# Patient Record
Sex: Female | Born: 1935 | Race: Black or African American | Hispanic: No | Smoking: Former smoker
Health system: Southern US, Community
[De-identification: ages and names within clinical notes are randomized; demographics above are authoritative.]

## PROBLEM LIST (undated history)

## (undated) DIAGNOSIS — I10 Essential (primary) hypertension: Secondary | ICD-10-CM

## (undated) HISTORY — PX: BREAST CYST EXCISION: SHX579

---

## 1997-11-16 ENCOUNTER — Ambulatory Visit (HOSPITAL_COMMUNITY): Admission: RE | Admit: 1997-11-16 | Discharge: 1997-11-16 | Payer: Self-pay | Admitting: Cardiology

## 2000-03-01 ENCOUNTER — Encounter: Payer: Self-pay | Admitting: Cardiology

## 2000-03-01 ENCOUNTER — Encounter: Admission: RE | Admit: 2000-03-01 | Discharge: 2000-03-01 | Payer: Self-pay | Admitting: Cardiology

## 2000-03-07 ENCOUNTER — Encounter: Payer: Self-pay | Admitting: Cardiology

## 2000-03-07 ENCOUNTER — Encounter: Admission: RE | Admit: 2000-03-07 | Discharge: 2000-03-07 | Payer: Self-pay | Admitting: Cardiology

## 2001-03-27 ENCOUNTER — Encounter: Payer: Self-pay | Admitting: Cardiology

## 2001-03-27 ENCOUNTER — Encounter: Admission: RE | Admit: 2001-03-27 | Discharge: 2001-03-27 | Payer: Self-pay | Admitting: Cardiology

## 2002-03-26 ENCOUNTER — Encounter: Admission: RE | Admit: 2002-03-26 | Discharge: 2002-03-26 | Payer: Self-pay | Admitting: Cardiology

## 2002-03-26 ENCOUNTER — Encounter: Payer: Self-pay | Admitting: Cardiology

## 2003-03-11 ENCOUNTER — Encounter: Payer: Self-pay | Admitting: Cardiology

## 2003-03-11 ENCOUNTER — Encounter: Admission: RE | Admit: 2003-03-11 | Discharge: 2003-03-11 | Payer: Self-pay | Admitting: Cardiology

## 2003-03-25 ENCOUNTER — Encounter: Payer: Self-pay | Admitting: Cardiology

## 2003-03-25 ENCOUNTER — Encounter: Admission: RE | Admit: 2003-03-25 | Discharge: 2003-03-25 | Payer: Self-pay | Admitting: Cardiology

## 2003-12-25 ENCOUNTER — Encounter: Admission: RE | Admit: 2003-12-25 | Discharge: 2003-12-25 | Payer: Self-pay | Admitting: Cardiology

## 2004-04-15 ENCOUNTER — Encounter: Admission: RE | Admit: 2004-04-15 | Discharge: 2004-04-15 | Payer: Self-pay | Admitting: Cardiology

## 2005-04-28 ENCOUNTER — Encounter: Admission: RE | Admit: 2005-04-28 | Discharge: 2005-04-28 | Payer: Self-pay | Admitting: Cardiology

## 2005-10-11 ENCOUNTER — Ambulatory Visit (HOSPITAL_COMMUNITY): Admission: RE | Admit: 2005-10-11 | Discharge: 2005-10-11 | Payer: Self-pay | Admitting: Cardiology

## 2006-05-02 ENCOUNTER — Encounter: Admission: RE | Admit: 2006-05-02 | Discharge: 2006-05-02 | Payer: Self-pay | Admitting: Cardiology

## 2007-05-09 ENCOUNTER — Encounter: Admission: RE | Admit: 2007-05-09 | Discharge: 2007-05-09 | Payer: Self-pay | Admitting: Cardiology

## 2008-02-19 ENCOUNTER — Encounter: Admission: RE | Admit: 2008-02-19 | Discharge: 2008-02-19 | Payer: Self-pay | Admitting: Cardiology

## 2008-05-20 ENCOUNTER — Encounter: Admission: RE | Admit: 2008-05-20 | Discharge: 2008-05-20 | Payer: Self-pay | Admitting: Cardiology

## 2008-05-27 ENCOUNTER — Encounter: Admission: RE | Admit: 2008-05-27 | Discharge: 2008-05-27 | Payer: Self-pay | Admitting: Cardiology

## 2009-01-20 ENCOUNTER — Encounter: Admission: RE | Admit: 2009-01-20 | Discharge: 2009-01-20 | Payer: Self-pay | Admitting: Cardiology

## 2009-05-26 ENCOUNTER — Encounter: Admission: RE | Admit: 2009-05-26 | Discharge: 2009-05-26 | Payer: Self-pay | Admitting: Cardiology

## 2009-06-16 ENCOUNTER — Encounter: Admission: RE | Admit: 2009-06-16 | Discharge: 2009-06-16 | Payer: Self-pay | Admitting: Cardiology

## 2009-12-24 ENCOUNTER — Encounter: Admission: RE | Admit: 2009-12-24 | Discharge: 2009-12-24 | Payer: Self-pay | Admitting: Cardiology

## 2010-06-01 ENCOUNTER — Encounter: Admission: RE | Admit: 2010-06-01 | Discharge: 2010-06-01 | Payer: Self-pay | Admitting: Cardiology

## 2011-05-05 ENCOUNTER — Other Ambulatory Visit: Payer: Self-pay | Admitting: Family Medicine

## 2011-05-05 DIAGNOSIS — Z1231 Encounter for screening mammogram for malignant neoplasm of breast: Secondary | ICD-10-CM

## 2011-06-07 ENCOUNTER — Ambulatory Visit
Admission: RE | Admit: 2011-06-07 | Discharge: 2011-06-07 | Disposition: A | Discharge status: Home / Self Care | Payer: Medicare Other | Source: Ambulatory Visit | Attending: Family Medicine | Admitting: Family Medicine

## 2011-06-07 DIAGNOSIS — Z1231 Encounter for screening mammogram for malignant neoplasm of breast: Secondary | ICD-10-CM

## 2012-05-01 ENCOUNTER — Other Ambulatory Visit: Payer: Self-pay | Admitting: Family Medicine

## 2012-05-01 DIAGNOSIS — Z1231 Encounter for screening mammogram for malignant neoplasm of breast: Secondary | ICD-10-CM

## 2012-06-07 ENCOUNTER — Ambulatory Visit
Admission: RE | Admit: 2012-06-07 | Discharge: 2012-06-07 | Disposition: A | Discharge status: Home / Self Care | Payer: Medicare Other | Source: Ambulatory Visit | Attending: Family Medicine | Admitting: Family Medicine

## 2012-06-07 DIAGNOSIS — Z1231 Encounter for screening mammogram for malignant neoplasm of breast: Secondary | ICD-10-CM

## 2013-05-13 ENCOUNTER — Other Ambulatory Visit: Payer: Self-pay

## 2013-05-13 DIAGNOSIS — Z1231 Encounter for screening mammogram for malignant neoplasm of breast: Secondary | ICD-10-CM

## 2013-06-11 ENCOUNTER — Ambulatory Visit
Admission: RE | Admit: 2013-06-11 | Discharge: 2013-06-11 | Disposition: A | Discharge status: Home / Self Care | Payer: Medicare Other | Source: Ambulatory Visit

## 2013-06-11 DIAGNOSIS — Z1231 Encounter for screening mammogram for malignant neoplasm of breast: Secondary | ICD-10-CM

## 2013-10-22 ENCOUNTER — Other Ambulatory Visit: Payer: Self-pay | Admitting: Cardiology

## 2013-10-22 ENCOUNTER — Other Ambulatory Visit (HOSPITAL_COMMUNITY): Payer: Self-pay | Admitting: Cardiology

## 2013-10-22 DIAGNOSIS — R0600 Dyspnea, unspecified: Secondary | ICD-10-CM

## 2013-10-22 DIAGNOSIS — R600 Localized edema: Secondary | ICD-10-CM

## 2013-10-28 ENCOUNTER — Ambulatory Visit
Admission: RE | Admit: 2013-10-28 | Discharge: 2013-10-28 | Disposition: A | Discharge status: Home / Self Care | Payer: Medicare HMO | Source: Ambulatory Visit | Attending: Cardiology | Admitting: Cardiology

## 2013-10-28 DIAGNOSIS — R600 Localized edema: Secondary | ICD-10-CM

## 2013-11-03 ENCOUNTER — Ambulatory Visit (HOSPITAL_COMMUNITY)
Admission: RE | Admit: 2013-11-03 | Discharge: 2013-11-03 | Disposition: A | Discharge status: Home / Self Care | Payer: Medicare HMO | Source: Ambulatory Visit | Attending: Cardiology | Admitting: Cardiology

## 2013-11-03 DIAGNOSIS — R0989 Other specified symptoms and signs involving the circulatory and respiratory systems: Principal | ICD-10-CM | POA: Insufficient documentation

## 2013-11-03 DIAGNOSIS — R0600 Dyspnea, unspecified: Secondary | ICD-10-CM

## 2013-11-03 DIAGNOSIS — R0609 Other forms of dyspnea: Secondary | ICD-10-CM | POA: Insufficient documentation

## 2013-11-03 NOTE — Progress Notes (Signed)
Echocardiogram 2D Echocardiogram has been performed.  Robin Hobbs 11/03/2013, 2:47 PM

## 2014-01-06 ENCOUNTER — Other Ambulatory Visit (HOSPITAL_COMMUNITY): Payer: Self-pay | Admitting: Cardiology

## 2014-01-06 ENCOUNTER — Ambulatory Visit (HOSPITAL_COMMUNITY)
Admission: RE | Admit: 2014-01-06 | Discharge: 2014-01-06 | Disposition: A | Discharge status: Home / Self Care | Payer: Medicare HMO | Source: Ambulatory Visit | Attending: Cardiology | Admitting: Cardiology

## 2014-01-06 DIAGNOSIS — M7989 Other specified soft tissue disorders: Secondary | ICD-10-CM

## 2014-01-06 DIAGNOSIS — M79609 Pain in unspecified limb: Secondary | ICD-10-CM

## 2014-01-06 DIAGNOSIS — R609 Edema, unspecified: Secondary | ICD-10-CM | POA: Insufficient documentation

## 2014-01-06 NOTE — Progress Notes (Signed)
VASCULAR LAB PRELIMINARY  PRELIMINARY  PRELIMINARY  PRELIMINARY  Left lower extremity venous duplex completed.    Preliminary report:  Left:  No evidence of DVT, superficial thrombosis, or Baker's cyst.  [place], RVS 01/06/2014, 3:35 PM

## 2014-03-19 ENCOUNTER — Encounter: Payer: Self-pay | Admitting: Podiatry

## 2014-03-19 ENCOUNTER — Ambulatory Visit (INDEPENDENT_AMBULATORY_CARE_PROVIDER_SITE_OTHER): Payer: Medicare HMO

## 2014-03-19 ENCOUNTER — Ambulatory Visit (INDEPENDENT_AMBULATORY_CARE_PROVIDER_SITE_OTHER): Payer: Medicare HMO | Admitting: Podiatry

## 2014-03-19 DIAGNOSIS — M722 Plantar fascial fibromatosis: Secondary | ICD-10-CM

## 2014-03-19 MED ORDER — TRIAMCINOLONE ACETONIDE 10 MG/ML IJ SUSP
10.0000 mg | Freq: Once | INTRAMUSCULAR | Status: AC
  Administered 2014-03-19: 10 mg

## 2014-03-19 NOTE — Progress Notes (Signed)
   Subjective:    Patient ID: Robin Hobbs, female    DOB: 04-14-1926, 81 y.o.   MRN: [id number]  HPI Comments: "My heel hurts"  Patient c/o aching plantar heel left for a month. She does have AM pain. She has seen other docs but no treatment currently. She has an insole that one doc gave and she wears it but no help.  Foot Pain      Review of Systems  All other systems reviewed and are negative.      Objective:   Physical Exam        Assessment & Plan:

## 2014-03-19 NOTE — Patient Instructions (Signed)

## 2014-03-20 NOTE — Progress Notes (Signed)
Subjective:     Patient ID: Robin Hobbs, female   DOB: 1926-07-17, 80 y.o.   MRN: [id number]  Foot Pain   patient presents with pain in her left plantar heel at the insertion of the tendon into the calcaneus of one-month duration   Review of Systems  All other systems reviewed and are negative.      Objective:   Physical Exam  Nursing note and vitals reviewed. Constitutional: She is oriented to person, place, and time.  Cardiovascular: Intact distal pulses.   Musculoskeletal: Normal range of motion.  Neurological: She is oriented to person, place, and time.  Skin: Skin is warm and dry.   neurovascular status intact with muscle strength adequate and range of motion of the subtalar midtarsal joint within normal limits. Patient's found to have discomfort in the plantar heel at the insertion left to the calcaneus and is noted to have well-perfused digits good hair growth and patient well oriented x3    Assessment:     Plantar fasciitis of an acute nature heel left    Plan:     H&P and x-rays reviewed and injected the left plantar fashion 3 mg Kenalog 5 mg like Marcaine mixture and instructed on physical therapy

## 2014-03-26 ENCOUNTER — Encounter: Payer: Self-pay | Admitting: Podiatry

## 2014-03-26 ENCOUNTER — Ambulatory Visit (INDEPENDENT_AMBULATORY_CARE_PROVIDER_SITE_OTHER): Payer: Medicare HMO | Admitting: Podiatry

## 2014-03-26 VITALS — BP 191/73 | HR 68 | Resp 16

## 2014-03-26 DIAGNOSIS — M722 Plantar fascial fibromatosis: Secondary | ICD-10-CM

## 2014-03-26 MED ORDER — TRIAMCINOLONE ACETONIDE 10 MG/ML IJ SUSP
10.0000 mg | Freq: Once | INTRAMUSCULAR | Status: AC
  Administered 2014-03-26: 10 mg

## 2014-03-26 NOTE — Progress Notes (Signed)
Subjective:     Patient ID: JALECIA DO, female   DOB: Nov 04, 1925, 80 y.o.   MRN: [id number]  HPI patient states my heel is still hurting left even though I think it is some improved. I been able to do my walks but it does bother me   Review of Systems     Objective:   Physical Exam Neurovascular status unchanged with pain in the plantar heel left at the insertion of the tendon into the calcaneus    Assessment:     Plantar fasciitis left with inflammation and fluid still noted    Plan:     Reinjected the plantar fascia 3 mg Kenalog 5 mg Xylocaine Marcaine mixture gave instructions on physical therapy and dispensed night splint with instructions on usage due to pain which is most prevalent in the morning

## 2014-04-16 ENCOUNTER — Encounter: Payer: Self-pay | Admitting: Podiatry

## 2014-04-16 ENCOUNTER — Ambulatory Visit (INDEPENDENT_AMBULATORY_CARE_PROVIDER_SITE_OTHER): Payer: Medicare HMO | Admitting: Podiatry

## 2014-04-16 VITALS — BP 187/90 | HR 82 | Resp 16

## 2014-04-16 DIAGNOSIS — M722 Plantar fascial fibromatosis: Secondary | ICD-10-CM

## 2014-04-16 NOTE — Progress Notes (Signed)
Subjective:     Patient ID: Robin Hobbs, female   DOB: 1926-05-17, 80 y.o.   MRN: [id number]  HPI patient is experiencing mild plantar heel pain left but is having more pain in the outside of the left leg where she is probably walking differently   Review of Systems     Objective:   Physical Exam Neurovascular status intact with minimal discomfort to palpation left plantar heel medial side and discomfort on the outside of the left lower leg with no indications of increased warmth swelling noted    Assessment:     Plantar fasciitis improving left with probable compensation into the left lower outer leg    Plan:     Reviewed condition and discussed continued night splint usage ice therapy and heat to the outer leg and if it continues to hurt to see her primary care doctor

## 2014-05-11 ENCOUNTER — Other Ambulatory Visit: Payer: Self-pay

## 2014-05-11 DIAGNOSIS — Z1231 Encounter for screening mammogram for malignant neoplasm of breast: Secondary | ICD-10-CM

## 2014-06-15 ENCOUNTER — Ambulatory Visit
Admission: RE | Admit: 2014-06-15 | Discharge: 2014-06-15 | Disposition: A | Discharge status: Home / Self Care | Payer: Medicare HMO | Source: Ambulatory Visit

## 2014-06-15 DIAGNOSIS — Z1231 Encounter for screening mammogram for malignant neoplasm of breast: Secondary | ICD-10-CM

## 2014-06-22 ENCOUNTER — Other Ambulatory Visit: Payer: Self-pay | Admitting: Cardiology

## 2014-06-22 ENCOUNTER — Ambulatory Visit
Admission: RE | Admit: 2014-06-22 | Discharge: 2014-06-22 | Disposition: A | Discharge status: Home / Self Care | Payer: Medicare HMO | Source: Ambulatory Visit | Attending: Cardiology | Admitting: Cardiology

## 2014-06-22 DIAGNOSIS — M25511 Pain in right shoulder: Secondary | ICD-10-CM

## 2015-04-27 ENCOUNTER — Other Ambulatory Visit: Payer: Self-pay | Admitting: Cardiology

## 2015-04-27 DIAGNOSIS — M8580 Other specified disorders of bone density and structure, unspecified site: Secondary | ICD-10-CM

## 2015-04-27 DIAGNOSIS — R29890 Loss of height: Secondary | ICD-10-CM

## 2015-06-07 ENCOUNTER — Ambulatory Visit
Admission: RE | Admit: 2015-06-07 | Discharge: 2015-06-07 | Disposition: A | Discharge status: Home / Self Care | Payer: Medicare HMO | Source: Ambulatory Visit | Attending: Cardiology | Admitting: Cardiology

## 2015-06-07 DIAGNOSIS — M8580 Other specified disorders of bone density and structure, unspecified site: Secondary | ICD-10-CM

## 2015-06-07 DIAGNOSIS — R29890 Loss of height: Secondary | ICD-10-CM

## 2015-07-29 ENCOUNTER — Other Ambulatory Visit: Payer: Self-pay

## 2015-07-29 DIAGNOSIS — Z1231 Encounter for screening mammogram for malignant neoplasm of breast: Secondary | ICD-10-CM

## 2015-08-24 ENCOUNTER — Ambulatory Visit
Admission: RE | Admit: 2015-08-24 | Discharge: 2015-08-24 | Disposition: A | Discharge status: Home / Self Care | Payer: Medicare HMO | Source: Ambulatory Visit

## 2015-08-24 DIAGNOSIS — Z1231 Encounter for screening mammogram for malignant neoplasm of breast: Secondary | ICD-10-CM

## 2016-07-18 ENCOUNTER — Other Ambulatory Visit: Payer: Self-pay | Admitting: Cardiology

## 2016-07-18 DIAGNOSIS — Z1231 Encounter for screening mammogram for malignant neoplasm of breast: Secondary | ICD-10-CM

## 2016-08-24 ENCOUNTER — Ambulatory Visit: Payer: Medicare HMO

## 2016-09-21 ENCOUNTER — Ambulatory Visit: Payer: Medicare HMO

## 2016-10-23 ENCOUNTER — Other Ambulatory Visit: Payer: Self-pay | Admitting: Cardiology

## 2016-10-23 DIAGNOSIS — Z1231 Encounter for screening mammogram for malignant neoplasm of breast: Secondary | ICD-10-CM

## 2016-11-08 ENCOUNTER — Other Ambulatory Visit: Payer: Self-pay | Admitting: Cardiology

## 2016-11-08 ENCOUNTER — Ambulatory Visit
Admission: RE | Admit: 2016-11-08 | Discharge: 2016-11-08 | Disposition: A | Discharge status: Home / Self Care | Payer: Medicare HMO | Source: Ambulatory Visit | Attending: Cardiology | Admitting: Cardiology

## 2016-11-08 DIAGNOSIS — Z1231 Encounter for screening mammogram for malignant neoplasm of breast: Secondary | ICD-10-CM

## 2016-11-09 ENCOUNTER — Other Ambulatory Visit: Payer: Self-pay | Admitting: Cardiology

## 2016-11-09 DIAGNOSIS — R928 Other abnormal and inconclusive findings on diagnostic imaging of breast: Secondary | ICD-10-CM

## 2016-11-15 ENCOUNTER — Ambulatory Visit
Admission: RE | Admit: 2016-11-15 | Discharge: 2016-11-15 | Disposition: A | Discharge status: Home / Self Care | Payer: Medicare HMO | Source: Ambulatory Visit | Attending: Cardiology | Admitting: Cardiology

## 2016-11-15 DIAGNOSIS — R928 Other abnormal and inconclusive findings on diagnostic imaging of breast: Secondary | ICD-10-CM

## 2017-01-15 ENCOUNTER — Encounter (HOSPITAL_COMMUNITY): Payer: Self-pay

## 2017-01-15 ENCOUNTER — Emergency Department (HOSPITAL_COMMUNITY)
Admission: EM | Admit: 2017-01-15 | Discharge: 2017-01-15 | Disposition: A | Discharge status: Home / Self Care | Payer: Medicare HMO | Attending: Emergency Medicine | Admitting: Emergency Medicine

## 2017-01-15 ENCOUNTER — Emergency Department (HOSPITAL_COMMUNITY): Payer: Medicare HMO

## 2017-01-15 DIAGNOSIS — Z79899 Other long term (current) drug therapy: Secondary | ICD-10-CM | POA: Diagnosis not present

## 2017-01-15 DIAGNOSIS — I10 Essential (primary) hypertension: Secondary | ICD-10-CM | POA: Insufficient documentation

## 2017-01-15 DIAGNOSIS — M546 Pain in thoracic spine: Secondary | ICD-10-CM

## 2017-01-15 DIAGNOSIS — Z87891 Personal history of nicotine dependence: Secondary | ICD-10-CM | POA: Insufficient documentation

## 2017-01-15 DIAGNOSIS — N3000 Acute cystitis without hematuria: Secondary | ICD-10-CM

## 2017-01-15 HISTORY — DX: Essential (primary) hypertension: I10

## 2017-01-15 LAB — BASIC METABOLIC PANEL
Anion gap: 11 (ref 5–15)
BUN: 18 mg/dL (ref 6–20)
CALCIUM: 10.4 mg/dL — AB (ref 8.9–10.3)
CO2: 30 mmol/L (ref 22–32)
Chloride: 101 mmol/L (ref 101–111)
Creatinine, Ser: 1.19 mg/dL — ABNORMAL HIGH (ref 0.44–1.00)
GFR calc Af Amer: 46 mL/min — ABNORMAL LOW (ref >60)
GFR calc non Af Amer: 40 mL/min — ABNORMAL LOW (ref >60)
Glucose, Bld: 105 mg/dL — ABNORMAL HIGH (ref 65–99)
POTASSIUM: 3 mmol/L — AB (ref 3.5–5.1)
SODIUM: 142 mmol/L (ref 135–145)

## 2017-01-15 LAB — CBC
HCT: 40.2 % (ref 36.0–46.0)
Hemoglobin: 13.3 g/dL (ref 12.0–15.0)
MCH: 28.2 pg (ref 26.0–34.0)
MCHC: 33.1 g/dL (ref 30.0–36.0)
MCV: 85.4 fL (ref 78.0–100.0)
Platelets: 214 10*3/uL (ref 150–400)
RBC: 4.71 MIL/uL (ref 3.87–5.11)
RDW: 13.8 % (ref 11.5–15.5)
WBC: 6.5 10*3/uL (ref 4.0–10.5)

## 2017-01-15 LAB — URINALYSIS, ROUTINE W REFLEX MICROSCOPIC
BILIRUBIN URINE: NEGATIVE
Glucose, UA: NEGATIVE mg/dL
HGB URINE DIPSTICK: NEGATIVE
KETONES UR: NEGATIVE mg/dL
Nitrite: NEGATIVE
Protein, ur: NEGATIVE mg/dL
Specific Gravity, Urine: 1.009 (ref 1.005–1.030)
pH: 5 (ref 5.0–8.0)

## 2017-01-15 LAB — I-STAT TROPONIN, ED: Troponin i, poc: 0.05 ng/mL (ref 0.00–0.08)

## 2017-01-15 MED ORDER — POTASSIUM CHLORIDE CRYS ER 20 MEQ PO TBCR
40.0000 meq | EXTENDED_RELEASE_TABLET | Freq: Once | ORAL | Status: AC
  Administered 2017-01-15: 40 meq via ORAL
  Filled 2017-01-15: qty 2

## 2017-01-15 MED ORDER — LABETALOL HCL 5 MG/ML IV SOLN
20.0000 mg | Freq: Once | INTRAVENOUS | Status: DC
  Filled 2017-01-15: qty 4

## 2017-01-15 MED ORDER — CLONIDINE HCL 0.2 MG PO TABS
0.2000 mg | ORAL_TABLET | Freq: Once | ORAL | Status: AC
  Administered 2017-01-15: 0.2 mg via ORAL
  Filled 2017-01-15: qty 1

## 2017-01-15 MED ORDER — HYDROCODONE-ACETAMINOPHEN 5-325 MG PO TABS
1.0000 | ORAL_TABLET | Freq: Once | ORAL | Status: AC
  Administered 2017-01-15: 1 via ORAL
  Filled 2017-01-15: qty 1

## 2017-01-15 MED ORDER — CIPROFLOXACIN HCL 250 MG PO TABS: 250.0000 mg | ORAL_TABLET | Freq: Two times a day (BID) | ORAL | 0 refills | Status: DC

## 2017-01-15 NOTE — ED Provider Notes (Signed)
Patient seen and evaluated. Discussed with PA. Patient complains of left lower thoracic back pain. Was hypertensive. Plan treating blood pressure, ACS rule out, thoracic spine x-rays chest x-ray. We'll reevaluate.   Tanna Furry, MD 01/15/17 [phone]

## 2017-01-15 NOTE — Discharge Instructions (Addendum)
Follow-up with Dr. Terrence Dupont in the next 24-48 hours for further evaluation.  Take antibiotics as directed. Please take all of your antibiotics until finished.  Take your potassium pills as directed.   Return to the Emergency Department for any worsening pain, fever, chest pain, difficulty breathing, or any other worsening or concerning symptoms.

## 2017-01-15 NOTE — ED Triage Notes (Signed)
Per Pt, Pt was sent over from MD Harwarni's office. Pt is unsure of why she was sent over. Pt reports mid-center upper back pain for two weeks. Pt reports no medical Hx besides HTN. Denies Chest pain.

## 2017-01-15 NOTE — ED Notes (Addendum)
Pt. Used restroom, put patient back on cardiac monitor, BP remains high, UA sent

## 2017-01-15 NOTE — ED Provider Notes (Signed)
[facility] DEPT Provider Note   CSN: [id number] Arrival date & time: 01/15/17  1409     History   Chief Complaint Chief Complaint  Patient presents with  . Back Pain    HPI Robin Hobbs is a 83 y.o. female who presents to the emergency department from her primary care doctor's office. Patient is unsure why she came. She states that she was being seen by her primary care doctor for evaluation of 4 weeks of upper left-sided back pain. Patient states that she has been taking over-the-counter Tylenol and ibuprofen with minimal relief in back pain symptoms. Patient states that her doctor told her to come to the emergency department for further evaluation. Denies fevers, weight loss, numbness/weakness of upper and lower extremities, bowel/bladder incontinence, saddle anesthesia, history of back surgery, history of IVDA. Patient denies any headache, dizziness, vision changes, chest pain, dysuria, hematuria, nausea/vomiting.   The history is provided by the patient.    Past Medical History:  Diagnosis Date  . Hypertension     There are no active problems to display for this patient.   Past Surgical History:  Procedure Laterality Date  . BREAST CYST EXCISION      OB History    No data available       Home Medications    Prior to Admission medications   Medication Sig Start Date End Date Taking? Authorizing Provider  amLODipine (NORVASC) 5 MG tablet  03/02/14   [provider]  ciprofloxacin (CIPRO) 250 MG tablet Take 1 tablet (250 mg total) by mouth every 12 (twelve) hours. 01/15/17   Volanda Napoleon, PA-C  cloNIDine (CATAPRES) 0.2 MG tablet  03/05/14   [provider]  metoprolol succinate (TOPROL-XL) 50 MG 24 hr tablet  03/11/14   [provider]  valsartan-hydrochlorothiazide (DIOVAN-HCT) 320-25 MG per tablet  03/02/14   [provider]    Family History No family history on file.  Social History Social History  Substance Use  Topics  . Smoking status: Former Research scientist (life sciences)  . Smokeless tobacco: Never Used  . Alcohol use No     Allergies   Patient has no known allergies.   Review of Systems Review of Systems  Constitutional: Negative for chills, diaphoresis, fever and unexpected weight change.  HENT: Negative for congestion, rhinorrhea and sore throat.   Eyes: Negative for visual disturbance.  Respiratory: Negative for cough and shortness of breath.   Cardiovascular: Negative for chest pain.  Gastrointestinal: Negative for abdominal pain, diarrhea, nausea and vomiting.  Genitourinary: Positive for flank pain (Left). Negative for dysuria and hematuria.  Musculoskeletal: Positive for back pain. Negative for neck pain.  Neurological: Negative for dizziness, weakness, numbness and headaches.  All other systems reviewed and are negative.    Physical Exam Updated Vital Signs BP (!) 148/61   Pulse 70   Temp 98.3 F (36.8 C) (Oral)   Resp 15   Ht 5\' 5"  (1.651 m)   Wt 81.6 kg (180 lb)   SpO2 94%   BMI 29.95 kg/m   Physical Exam  Constitutional: She is oriented to person, place, and time. She appears well-developed and well-nourished.  Sitting comfortably on examination table  HENT:  Head: Normocephalic and atraumatic.  Mouth/Throat: Uvula is midline, oropharynx is clear and moist and mucous membranes are normal.  Eyes: Conjunctivae, EOM and lids are normal.  Right pupil was oval-shaped and left pupil is circular (patient reports a history of cataract surgery on bilateral eyes)  Neck: Full passive  range of motion without pain.  Full flexion/extension and lateral movement of neck fully intact. No bony midline tenderness. No deformities or crepitus.   Cardiovascular: Normal rate, regular rhythm, normal heart sounds and normal pulses.  Exam reveals no gallop and no friction rub.   No murmur heard. Pulmonary/Chest: Effort normal and breath sounds normal.  Abdominal: Soft. Normal appearance. There is no  tenderness. There is no rigidity and no guarding.  Musculoskeletal: Normal range of motion.       Thoracic back: She exhibits tenderness.       Lumbar back: She exhibits no tenderness.       Back:  Diffuse muscular tenderness overlying the thoracic region and extends the left paraspinal muscles  Neurological: She is alert and oriented to person, place, and time. GCS eye subscore is 4. GCS verbal subscore is 5. GCS motor subscore is 6.  Cranial nerves III-XII intact Follows commands, Moves all extremities  5/5 strength to BUE and BLE  Sensation intact throughout  Normal finger to nose. No dysdiadochokinesia. No pronator drift. No gait abnormalities  No slurred speech. No facial droop.   Skin: Skin is warm and dry. Capillary refill takes less than 2 seconds.  Psychiatric: She has a normal mood and affect. Her speech is normal.  Nursing note and vitals reviewed.    ED Treatments / Results  Labs (all labs ordered are listed, but only abnormal results are displayed) Labs Reviewed  URINE CULTURE - Abnormal; Notable for the following:       Result Value   Culture MULTIPLE SPECIES PRESENT, SUGGEST RECOLLECTION (*)    All other components within normal limits  URINALYSIS, ROUTINE W REFLEX MICROSCOPIC - Abnormal; Notable for the following:    Leukocytes, UA MODERATE (*)    Bacteria, UA RARE (*)    Squamous Epithelial / LPF 0-5 (*)    All other components within normal limits  BASIC METABOLIC PANEL - Abnormal; Notable for the following:    Potassium 3.0 (*)    Glucose, Bld 105 (*)    Creatinine, Ser 1.19 (*)    Calcium 10.4 (*)    GFR calc non Af Amer 40 (*)    GFR calc Af Amer 46 (*)    All other components within normal limits  CBC  I-STAT TROPOININ, ED    EKG  EKG Interpretation  Date/Time:  Monday January 15 2017 15:11:29 EDT Ventricular Rate:  62 PR Interval:  150 QRS Duration: 88 QT Interval:  396 QTC Calculation: 401 R Axis:   -3 Text Interpretation:  Normal sinus  rhythm Normal ECG No previous ECGs available Confirmed by Gareth Morgan [phone]) on 01/17/2017 12:02:31 AM       Radiology No results found.  Procedures Procedures (including critical care time)  Medications Ordered in ED Medications  HYDROcodone-acetaminophen (NORCO/VICODIN) 5-325 MG per tablet 1 tablet (1 tablet Oral Given 01/15/17 1849)  cloNIDine (CATAPRES) tablet 0.2 mg (0.2 mg Oral Given 01/15/17 1848)  potassium chloride SA (K-DUR,KLOR-CON) CR tablet 40 mEq (40 mEq Oral Given 01/15/17 2127)     Initial Impression / Assessment and Plan / ED Course  I have reviewed the triage vital signs and the nursing notes.  Pertinent labs & imaging results that were available during my care of the patient were reviewed by me and considered in my medical decision making (see chart for details).     83 year old female with past medical history of hypertension he was sent over here by primary care doctor. She  is unsure of the reason why. Only complaint at this time is back pain which she has been expressing for the last 4 weeks. On initial evaluation, patient is hypertensive with systolic blood pressure in the 200s. No symptoms at this time. Patient is currently on hypertension medications and states that she has been compliant with the mall. She has not taken her nightly dose of clonidine. Patient reports that she has been monitoring her blood pressures for the past few weeks and they have been averaging in the 140s. She denies any elevated blood pressure readings. No neuro deficits. No red flag symptoms on exam. Given that she has had ongoing back pain for the past 4 weeks will plan to obtain x-ray of thoracic spine for evaluation. Will plan to order additional labs including CBC, BMP, UA, troponin. EKG ordered at triage. Will plan to get a antihypertension medications in the department.  Blood pressure is down to 193/75 without additional medications. We'll plan to still give the clonidine since  that her daily medication. Will likely be at all for now.  Repeat blood pressure is 170/80 with only clonidine. XRs pending.   Labs and imaging reviewed. Initial troponin is negative. CBC within normal limits. BMP shows slight hypokalemia. We'll plan to give oral potassium replacement. Slight bump in creatinine. UA reveals moderate leukocytes with some bacteria. Given that patient is having left-sided back/upper flank pain, Will treat as a UA/pyelonephritis. There is of back reviewed. Negative for any acute process. Chest x-ray negative for any acute infectious etiology.  Discussed results with patient. She is supposed to be taking oral potassium pills at home as her potassium has been low before. She has not been taking them in the last several days. Patient's pain is improved after pain medication here in the department. Blood pressure has improved into the 140s. Other vitals remained stable. Patient is stable for discharge at this time. Instructed patient to follow-up with her primary care doctor next 24-48 hours for further evaluation.  Final Clinical Impressions(s) / ED Diagnoses   Final diagnoses:  Acute left-sided thoracic back pain  Acute cystitis without hematuria    New Prescriptions Discharge Medication List as of 01/15/2017  9:32 PM    START taking these medications   Details  ciprofloxacin (CIPRO) 250 MG tablet Take 1 tablet (250 mg total) by mouth every 12 (twelve) hours., Starting Mon 01/15/2017, Print         Volanda Napoleon, PA-C 01/18/17 1047    Tanna Furry, MD 01/22/17 [phone]

## 2017-01-17 LAB — URINE CULTURE

## 2018-01-01 ENCOUNTER — Other Ambulatory Visit: Payer: Self-pay | Admitting: Cardiology

## 2018-01-01 DIAGNOSIS — Z1231 Encounter for screening mammogram for malignant neoplasm of breast: Secondary | ICD-10-CM

## 2018-01-22 ENCOUNTER — Ambulatory Visit
Admission: RE | Admit: 2018-01-22 | Discharge: 2018-01-22 | Disposition: A | Discharge status: Home / Self Care | Payer: Medicare HMO | Source: Ambulatory Visit | Attending: Cardiology | Admitting: Cardiology

## 2018-01-22 DIAGNOSIS — Z1231 Encounter for screening mammogram for malignant neoplasm of breast: Secondary | ICD-10-CM

## 2020-02-27 ENCOUNTER — Emergency Department (HOSPITAL_COMMUNITY): Payer: Medicare HMO

## 2020-02-27 ENCOUNTER — Other Ambulatory Visit: Payer: Self-pay

## 2020-02-27 ENCOUNTER — Encounter (HOSPITAL_COMMUNITY): Payer: Self-pay | Admitting: Pharmacy Technician

## 2020-02-27 ENCOUNTER — Inpatient Hospital Stay (HOSPITAL_COMMUNITY)
Admission: EM | Admit: 2020-02-27 | Discharge: 2020-03-03 | DRG: 690 | Disposition: A | Discharge status: Home Health | Payer: Medicare HMO | Attending: Cardiology | Admitting: Cardiology

## 2020-02-27 DIAGNOSIS — R4182 Altered mental status, unspecified: Secondary | ICD-10-CM | POA: Diagnosis not present

## 2020-02-27 DIAGNOSIS — E441 Mild protein-calorie malnutrition: Secondary | ICD-10-CM | POA: Diagnosis present

## 2020-02-27 DIAGNOSIS — E785 Hyperlipidemia, unspecified: Secondary | ICD-10-CM | POA: Diagnosis present

## 2020-02-27 DIAGNOSIS — Z87891 Personal history of nicotine dependence: Secondary | ICD-10-CM

## 2020-02-27 DIAGNOSIS — M1990 Unspecified osteoarthritis, unspecified site: Secondary | ICD-10-CM | POA: Diagnosis present

## 2020-02-27 DIAGNOSIS — F0390 Unspecified dementia without behavioral disturbance: Secondary | ICD-10-CM | POA: Diagnosis present

## 2020-02-27 DIAGNOSIS — I959 Hypotension, unspecified: Secondary | ICD-10-CM | POA: Diagnosis present

## 2020-02-27 DIAGNOSIS — N179 Acute kidney failure, unspecified: Secondary | ICD-10-CM | POA: Diagnosis present

## 2020-02-27 DIAGNOSIS — N390 Urinary tract infection, site not specified: Principal | ICD-10-CM | POA: Diagnosis present

## 2020-02-27 DIAGNOSIS — E860 Dehydration: Secondary | ICD-10-CM | POA: Diagnosis present

## 2020-02-27 DIAGNOSIS — I2510 Atherosclerotic heart disease of native coronary artery without angina pectoris: Secondary | ICD-10-CM | POA: Diagnosis present

## 2020-02-27 DIAGNOSIS — Z79899 Other long term (current) drug therapy: Secondary | ICD-10-CM | POA: Diagnosis not present

## 2020-02-27 DIAGNOSIS — I252 Old myocardial infarction: Secondary | ICD-10-CM | POA: Diagnosis not present

## 2020-02-27 DIAGNOSIS — I129 Hypertensive chronic kidney disease with stage 1 through stage 4 chronic kidney disease, or unspecified chronic kidney disease: Secondary | ICD-10-CM | POA: Diagnosis present

## 2020-02-27 DIAGNOSIS — R627 Adult failure to thrive: Secondary | ICD-10-CM | POA: Diagnosis present

## 2020-02-27 DIAGNOSIS — R778 Other specified abnormalities of plasma proteins: Secondary | ICD-10-CM | POA: Diagnosis present

## 2020-02-27 DIAGNOSIS — Z20822 Contact with and (suspected) exposure to covid-19: Secondary | ICD-10-CM | POA: Diagnosis present

## 2020-02-27 DIAGNOSIS — N184 Chronic kidney disease, stage 4 (severe): Secondary | ICD-10-CM | POA: Diagnosis present

## 2020-02-27 LAB — SARS CORONAVIRUS 2 BY RT PCR (HOSPITAL ORDER, PERFORMED IN ~~LOC~~ HOSPITAL LAB): SARS Coronavirus 2: NEGATIVE

## 2020-02-27 LAB — BASIC METABOLIC PANEL
Anion gap: 16 — ABNORMAL HIGH (ref 5–15)
BUN: 45 mg/dL — ABNORMAL HIGH (ref 8–23)
CO2: 21 mmol/L — ABNORMAL LOW (ref 22–32)
Calcium: 10.4 mg/dL — ABNORMAL HIGH (ref 8.9–10.3)
Chloride: 101 mmol/L (ref 98–111)
Creatinine, Ser: 2.94 mg/dL — ABNORMAL HIGH (ref 0.44–1.00)
GFR calc Af Amer: 15 mL/min — ABNORMAL LOW (ref >60)
GFR calc non Af Amer: 13 mL/min — ABNORMAL LOW (ref >60)
Glucose, Bld: 120 mg/dL — ABNORMAL HIGH (ref 70–99)
Potassium: 3.7 mmol/L (ref 3.5–5.1)
Sodium: 138 mmol/L (ref 135–145)

## 2020-02-27 LAB — CBC
HCT: 44.7 % (ref 36.0–46.0)
Hemoglobin: 14.2 g/dL (ref 12.0–15.0)
MCH: 28.2 pg (ref 26.0–34.0)
MCHC: 31.8 g/dL (ref 30.0–36.0)
MCV: 88.7 fL (ref 80.0–100.0)
Platelets: 272 10*3/uL (ref 150–400)
RBC: 5.04 MIL/uL (ref 3.87–5.11)
RDW: 13.9 % (ref 11.5–15.5)
WBC: 7 10*3/uL (ref 4.0–10.5)
nRBC: 0 % (ref 0.0–0.2)

## 2020-02-27 LAB — URINALYSIS, ROUTINE W REFLEX MICROSCOPIC
Bilirubin Urine: NEGATIVE
Glucose, UA: NEGATIVE mg/dL
Hgb urine dipstick: NEGATIVE
Ketones, ur: NEGATIVE mg/dL
Nitrite: NEGATIVE
Protein, ur: NEGATIVE mg/dL
Specific Gravity, Urine: 1.014 (ref 1.005–1.030)
pH: 5 (ref 5.0–8.0)

## 2020-02-27 LAB — CREATININE, SERUM
Creatinine, Ser: 2.74 mg/dL — ABNORMAL HIGH (ref 0.44–1.00)
GFR calc Af Amer: 17 mL/min — ABNORMAL LOW (ref >60)
GFR calc non Af Amer: 15 mL/min — ABNORMAL LOW (ref >60)

## 2020-02-27 LAB — TROPONIN I (HIGH SENSITIVITY)
Troponin I (High Sensitivity): 68 ng/L — ABNORMAL HIGH (ref <18)
Troponin I (High Sensitivity): 49 ng/L — ABNORMAL HIGH (ref <18)

## 2020-02-27 MED ORDER — SODIUM CHLORIDE 0.9 % IV BOLUS
500.0000 mL | Freq: Once | INTRAVENOUS | Status: AC
  Administered 2020-02-27: 500 mL via INTRAVENOUS

## 2020-02-27 MED ORDER — PANTOPRAZOLE SODIUM 40 MG PO TBEC
40.0000 mg | DELAYED_RELEASE_TABLET | Freq: Every day | ORAL | Status: DC
  Administered 2020-02-28 – 2020-03-03 (×5): 40 mg via ORAL
  Filled 2020-02-27 (×5): qty 1

## 2020-02-27 MED ORDER — SODIUM CHLORIDE 0.9 % IV SOLN: INTRAVENOUS | Status: DC

## 2020-02-27 MED ORDER — SODIUM CHLORIDE 0.9 % IV SOLN
1.0000 g | INTRAVENOUS | Status: AC
  Administered 2020-02-28 – 2020-03-02 (×4): 1 g via INTRAVENOUS
  Filled 2020-02-27 (×4): qty 1

## 2020-02-27 MED ORDER — METOPROLOL SUCCINATE ER 100 MG PO TB24
100.0000 mg | ORAL_TABLET | Freq: Every day | ORAL | Status: DC
  Administered 2020-02-27 – 2020-03-03 (×6): 100 mg via ORAL
  Filled 2020-02-27 (×6): qty 1

## 2020-02-27 MED ORDER — SODIUM CHLORIDE 0.9% FLUSH: 3.0000 mL | Freq: Once | INTRAVENOUS | Status: DC

## 2020-02-27 MED ORDER — SODIUM CHLORIDE 0.9 % IV SOLN
1.0000 g | Freq: Once | INTRAVENOUS | Status: AC
  Administered 2020-02-27: 1 g via INTRAVENOUS
  Filled 2020-02-27: qty 10

## 2020-02-27 MED ORDER — HEPARIN SODIUM (PORCINE) 5000 UNIT/ML IJ SOLN
5000.0000 [IU] | Freq: Three times a day (TID) | INTRAMUSCULAR | Status: DC
  Administered 2020-02-27 – 2020-03-03 (×15): 5000 [IU] via SUBCUTANEOUS
  Filled 2020-02-27 (×15): qty 1

## 2020-02-27 MED ORDER — ASPIRIN EC 81 MG PO TBEC
81.0000 mg | DELAYED_RELEASE_TABLET | Freq: Every day | ORAL | Status: DC
  Administered 2020-02-27 – 2020-03-03 (×6): 81 mg via ORAL
  Filled 2020-02-27 (×6): qty 1

## 2020-02-27 NOTE — ED Provider Notes (Addendum)
[facility] Provider Note   CSN: [id number] Arrival date & time: 02/27/20  0815     History Chief Complaint  Patient presents with  . Failure To Thrive    Robin Hobbs is a 86 y.o. female.  HPI She presents for evaluation of altered mental status, characterized by confusion, lethargy, decreased ambulation, decreased oral intake, and "Sundowners."  Per son, she saw her PCP yesterday who said that she might of had a heart attack, and change some of her medications.  He encouraged the patient to eat and drink more but she was unable to, so her son brought her here.  She lives alone but her son has been staying with her because she is getting worse.  She is unable to give history.  Level 5 caveat-altered mental status    Past Medical History:  Diagnosis Date  . Hypertension     Patient Active Problem List   Diagnosis Date Noted  . UTI (urinary tract infection) 02/27/2020    Past Surgical History:  Procedure Laterality Date  . BREAST CYST EXCISION Right      OB History   No obstetric history on file.     No family history on file.  Social History   Tobacco Use  . Smoking status: Former Research scientist (life sciences)  . Smokeless tobacco: Never Used  Substance Use Topics  . Alcohol use: No  . Drug use: No    Home Medications Prior to Admission medications   Medication Sig Start Date End Date Taking? Authorizing Provider  amLODipine (NORVASC) 5 MG tablet Take 5 mg by mouth daily.  03/02/14  Yes [provider]  cloNIDine (CATAPRES) 0.2 MG tablet Take 0.2 mg by mouth at bedtime.  03/05/14  Yes [provider]  KLOR-CON M10 10 MEQ tablet Take 10 mEq by mouth daily. 02/18/20  Yes [provider]  metoprolol succinate (TOPROL-XL) 50 MG 24 hr tablet Take 100 mg by mouth daily.  03/11/14  Yes [provider]    Allergies    Patient has no known allergies.  Review of Systems   Review of Systems  Unable to perform  ROS: Mental status change    Physical Exam Updated Vital Signs BP (!) 144/61 (BP Location: Left Arm)   Pulse 69   Temp 97.7 F (36.5 C) (Oral)   Resp 16   Ht 5\' 6"  (1.676 m)   SpO2 100%   BMI 29.05 kg/m   Physical Exam Vitals and nursing note reviewed.  Constitutional:      Appearance: She is well-developed.  HENT:     Head: Normocephalic and atraumatic.     Right Ear: External ear normal.     Left Ear: External ear normal.  Eyes:     Conjunctiva/sclera: Conjunctivae normal.     Pupils: Pupils are equal, round, and reactive to light.  Neck:     Trachea: Phonation normal.  Cardiovascular:     Rate and Rhythm: Normal rate and regular rhythm.     Heart sounds: Normal heart sounds.  Pulmonary:     Effort: Pulmonary effort is normal.     Breath sounds: Normal breath sounds.  Abdominal:     Palpations: Abdomen is soft.     Tenderness: There is no abdominal tenderness.  Musculoskeletal:        General: Normal range of motion.     Cervical back: Normal range of motion and neck supple.  Skin:    General: Skin is warm  and dry.  Neurological:     Mental Status: She is alert and oriented to person, place, and time.     Cranial Nerves: No cranial nerve deficit.     Sensory: No sensory deficit.     Motor: No abnormal muscle tone.     Coordination: Coordination normal.  Psychiatric:        Behavior: Behavior normal.        Thought Content: Thought content normal.        Judgment: Judgment normal.     ED Results / Procedures / Treatments   Labs (all labs ordered are listed, but only abnormal results are displayed) Labs Reviewed  BASIC METABOLIC PANEL - Abnormal; Notable for the following components:      Result Value   CO2 21 (*)    Glucose, Bld 120 (*)    BUN 45 (*)    Creatinine, Ser 2.94 (*)    Calcium 10.4 (*)    GFR calc non Af Amer 13 (*)    GFR calc Af Amer 15 (*)    Anion gap 16 (*)    All other components within normal limits  URINALYSIS, ROUTINE W  REFLEX MICROSCOPIC - Abnormal; Notable for the following components:   APPearance HAZY (*)    Leukocytes,Ua LARGE (*)    Bacteria, UA MANY (*)    All other components within normal limits  CREATININE, SERUM - Abnormal; Notable for the following components:   Creatinine, Ser 2.74 (*)    GFR calc non Af Amer 15 (*)    GFR calc Af Amer 17 (*)    All other components within normal limits  TROPONIN I (HIGH SENSITIVITY) - Abnormal; Notable for the following components:   Troponin I (High Sensitivity) 68 (*)    All other components within normal limits  TROPONIN I (HIGH SENSITIVITY) - Abnormal; Notable for the following components:   Troponin I (High Sensitivity) 49 (*)    All other components within normal limits  SARS CORONAVIRUS 2 BY RT PCR (HOSPITAL ORDER, [facility])  URINE CULTURE  CBC    EKG EKG Interpretation  Date/Time:  Friday February 27 2020 08:24:06 EDT Ventricular Rate:  107 PR Interval:  158 QRS Duration: 82 QT Interval:  342 QTC Calculation: 456 R Axis:   -7 Text Interpretation: Sinus tachycardia Septal infarct , age undetermined Abnormal ECG Since last tracing rate faster Otherwise no significant change Confirmed by Daleen Bo [id number]) on 02/27/2020 11:37:26 AM   Radiology DG Chest 2 View  Result Date: 02/27/2020 CLINICAL DATA:  Malaise and failure to thrive. EXAM: CHEST - 2 VIEW COMPARISON:  01/15/2017 and prior chest radiographs FINDINGS: The cardiomediastinal silhouette is unremarkable. There is no evidence of focal airspace disease, pulmonary edema, suspicious pulmonary nodule/mass, pleural effusion, or pneumothorax. No acute bony abnormalities are identified. IMPRESSION: No active cardiopulmonary disease. Electronically Signed   By: Margarette Canada M.D.   On: 02/27/2020 12:26   CT Head Wo Contrast  Result Date: 02/27/2020 CLINICAL DATA:  86 year old female with altered mental status. EXAM: CT HEAD WITHOUT CONTRAST TECHNIQUE: Contiguous  axial images were obtained from the base of the skull through the vertex without intravenous contrast. COMPARISON:  02/19/2008 CT FINDINGS: Brain: No evidence of acute infarction, hemorrhage, hydrocephalus, extra-axial collection or mass lesion/mass effect. Atrophy and mild chronic small-vessel white matter ischemic changes again noted. Vascular: No hyperdense vessel or unexpected calcification. Carotid atherosclerotic calcifications are noted. Skull: Normal. Negative for fracture or focal lesion.  Sinuses/Orbits: No acute finding. Other: None. IMPRESSION: 1. No evidence of acute intracranial abnormality. 2. Atrophy and chronic small-vessel white matter ischemic changes. Electronically Signed   By: Margarette Canada M.D.   On: 02/27/2020 13:02    Procedures .Critical Care Performed by: Daleen Bo, MD Authorized by: Daleen Bo, MD   Critical care provider statement:    Critical care time (minutes):  35   Critical care start time:  02/27/2020 11:30 AM   Critical care end time:  02/27/2020 2:31 PM   Critical care time was exclusive of:  Separately billable procedures and treating other patients   Critical care was necessary to treat or prevent imminent or life-threatening deterioration of the following conditions:  Circulatory failure   Critical care was time spent personally by me on the following activities:  Blood draw for specimens, development of treatment plan with patient or surrogate, discussions with consultants, evaluation of patient's response to treatment, examination of patient, obtaining history from patient or surrogate, ordering and performing treatments and interventions, ordering and review of laboratory studies, pulse oximetry, re-evaluation of patient's condition, review of old charts and ordering and review of radiographic studies   (including critical care time)  Medications Ordered in ED Medications  sodium chloride flush (NS) 0.9 % injection 3 mL (has no administration in time  range)  0.9 %  sodium chloride infusion ( Intravenous New Bag/Given 02/27/20 1359)  metoprolol succinate (TOPROL-XL) 24 hr tablet 100 mg (has no administration in time range)  heparin injection 5,000 Units (has no administration in time range)  0.9 %  sodium chloride infusion (has no administration in time range)  aspirin EC tablet 81 mg (has no administration in time range)  cefTRIAXone (ROCEPHIN) 1 g in sodium chloride 0.9 % 100 mL IVPB (1 g Intravenous Not Given 02/27/20 1501)  pantoprazole (PROTONIX) EC tablet 40 mg (has no administration in time range)  sodium chloride 0.9 % bolus 500 mL (0 mLs Intravenous Stopped 02/27/20 1403)  cefTRIAXone (ROCEPHIN) 1 g in sodium chloride 0.9 % 100 mL IVPB (0 g Intravenous Stopped 02/27/20 1447)    ED Course  I have reviewed the triage vital signs and the nursing notes.  Pertinent labs & imaging results that were available during my care of the patient were reviewed by me and considered in my medical decision making (see chart for details).  Clinical Course as of Feb 26 1550  Fri Feb 27, 2020  1135 Normal except CO2 low, glucose high, BUN high, creatinine high, calcium high, GFR low, anion gap high  Basic metabolic panel(!) [EW]  2505 Abnormal, elevated  Troponin I (High Sensitivity)(!) [EW]  1136 Normal  CBC [EW]  1315 Troponin I (High Sensitivity)(!) [EW]  1315 Abnormal, presence of leukocytes, RBCs, white cells and many bacteria.  Urine culture ordered  Urinalysis, Routine w reflex microscopic(!) [EW]  1425 Discussed with her PCP/cardiologist, Dr. Terrence Dupont, he will see the patient for admission.   [EW]  1550 Downward trending troponin   [EW]  1551 Normal  SARS Coronavirus 2 by RT PCR (hospital order, performed in [facility] hospital lab) Nasopharyngeal Nasopharyngeal Swab [EW]    Clinical Course User Index [EW] Daleen Bo, MD   MDM Rules/Calculators/A&P                           Patient Vitals for the past 24 hrs:  BP Temp Temp  src Pulse Resp SpO2 Height  02/27/20 1404 (!) 144/61 -- --  69 16 100 % --  02/27/20 1343 97/73 -- -- 71 19 -- --  02/27/20 1248 -- -- -- 82 20 -- --  02/27/20 1231 -- -- -- 80 16 -- --  02/27/20 1200 (!) 133/59 -- -- 80 18 100 % --  02/27/20 1130 (!) 136/58 -- -- 83 -- -- --  02/27/20 1127 (!) 140/55 -- -- -- -- -- --  02/27/20 0904 92/72 -- -- (!) 108 15 100 % --  02/27/20 0820 116/72 97.7 F (36.5 C) Oral (!) 108 18 99 % 5\' 6"  (1.676 m)    2:26 PM Reevaluation with update and discussion. After initial assessment and treatment, an updated evaluation reveals no change in clinical status, findings cussed with patient, son, all questions were answered. Daleen Bo   Medical Decision Making:  This patient is presenting for evaluation of weakness, confusion, decreased oral intake, which does require a range of treatment options, and is a complaint that involves a high risk of morbidity and mortality. The differential diagnoses include UTI, serious infection, coronary syndrome. I decided to review old records, and in summary elderly female, who lives alone and now has increasing confusion, over the last couple weeks.  During this time she has had decreased oral intake..  I obtained additional historical information from her son at the bedside.  Clinical Laboratory Tests Ordered, included CBC, Metabolic panel and Troponin, urinalysis. Review indicates elevated troponin, elevated BUN/creatinine, low CO2, low GFR, elevated calcium, abnormal urinalysis consistent with infection. Radiologic Tests Ordered, included CT head, chest x-ray.  I independently Visualized: Radiographic images, which show no acute abnormalities  Cardiac Monitor Tracing which shows sinus tachycardia   Critical Interventions-clinical evaluation, laboratory testing, radiographic imaging CT scan head and chest x-ray, EKG, IV fluids, IV Rocephin, observation reassessment  After These Interventions, the Patient was reevaluated  and was found to require hospitalization.  She has significant acute kidney injury likely secondary to dehydration, and elevated troponin, nonspecific with reassuring EKG.  She will require hospitalization for ongoing management.  CRITICAL CARE-yes Performed by: Daleen Bo  Nursing Notes Reviewed/ Care Coordinated Applicable Imaging Reviewed Interpretation of Laboratory Data incorporated into ED treatment   Plan: Admit    Final Clinical Impression(s) / ED Diagnoses Final diagnoses:  Urinary tract infection without hematuria, site unspecified  AKI (acute kidney injury) ([place])  Elevated troponin    Rx / DC Orders ED Discharge Orders    None           Daleen Bo, MD 02/27/20 1431    Daleen Bo, MD 02/27/20 1551

## 2020-02-27 NOTE — ED Triage Notes (Signed)
Pt bib family with reports of decreased po intake and lethargy for approx 1 week. Pt went to cardiologist yesterday and he states that she has had a mild heart attack. Family also states pt with repetitive questioning.

## 2020-02-27 NOTE — H&P (Signed)
Robin Hobbs is an 86 y.o. female.   Chief Complaint: Feeling tired fatigue, lethargy no energy poor intake for last 1 week HPI: Patient is 86 year old female with past medical history significant for hypertension, hyperlipidemia, degenerative joint disease, chronic kidney disease stage II, was seen in my office yesterday because of feeling tired fatigue lethargy no energy and poor p.o. intake for last 1 week.  Patient denied any chest pain nausea vomiting diaphoresis.  Denies shortness of breath.  Denies fever chills denies any urinary complaints EKG done in my office showed new septal Q waves with no acute ST elevation as compared to prior ECG.  Patient appeared to be dehydrated was advised to stop her spironolactone and hydrochlorothiazide.  States since yesterday her condition did not improve and felt some chills so decided to come to the ED.  In ED patient was noted to be dehydrated with acute renal failure and UTI.  High-sensitivity troponin I very minimally elevated which were attributed to questionable recent septal MI versus acute on chronic kidney injury.  Past Medical History:  Diagnosis Date  . Hypertension     Past Surgical History:  Procedure Laterality Date  . BREAST CYST EXCISION Right     No family history on file. Social History:  reports that she has quit smoking. She has never used smokeless tobacco. She reports that she does not drink alcohol and does not use drugs.  Allergies: No Known Allergies  (Not in a hospital admission)   Results for orders placed or performed during the hospital encounter of 02/27/20 (from the past 48 hour(s))  Basic metabolic panel     Status: Abnormal   Collection Time: 02/27/20  8:31 AM  Result Value Ref Range   Sodium 138 135 - 145 mmol/L   Potassium 3.7 3.5 - 5.1 mmol/L   Chloride 101 98 - 111 mmol/L   CO2 21 (L) 22 - 32 mmol/L   Glucose, Bld 120 (H) 70 - 99 mg/dL    Comment: Glucose reference range applies only to samples taken  after fasting for at least 8 hours.   BUN 45 (H) 8 - 23 mg/dL   Creatinine, Ser 2.94 (H) 0.44 - 1.00 mg/dL   Calcium 10.4 (H) 8.9 - 10.3 mg/dL   GFR calc non Af Amer 13 (L) >60 mL/min   GFR calc Af Amer 15 (L) >60 mL/min   Anion gap 16 (H) 5 - 15    Comment: Performed at [facility] [address] [place], [place] [address]  CBC     Status: None   Collection Time: 02/27/20  8:31 AM  Result Value Ref Range   WBC 7.0 4.0 - 10.5 K/uL   RBC 5.04 3.87 - 5.11 MIL/uL   Hemoglobin 14.2 12.0 - 15.0 g/dL   HCT 44.7 36 - 46 %   MCV 88.7 80.0 - 100.0 fL   MCH 28.2 26.0 - 34.0 pg   MCHC 31.8 30.0 - 36.0 g/dL   RDW 13.9 11.5 - 15.5 %   Platelets 272 150 - 400 K/uL   nRBC 0.0 0.0 - 0.2 %    Comment: Performed at [facility], [place] [address], [place], [place] [address]  Troponin I (High Sensitivity)     Status: Abnormal   Collection Time: 02/27/20  8:31 AM  Result Value Ref Range   Troponin I (High Sensitivity) 68 (H) <18 ng/L    Comment: (NOTE) Elevated high sensitivity troponin I (hsTnI) values and significant  changes across serial measurements may suggest ACS but many other  chronic and acute conditions are known to elevate hsTnI results.  Refer to the "Links" section for chest pain algorithms and additional  guidance. Performed at [facility], [place] [address] [place], [place] [address]   Urinalysis, Routine w reflex microscopic     Status: Abnormal   Collection Time: 02/27/20 11:55 AM  Result Value Ref Range   Color, Urine YELLOW YELLOW   APPearance HAZY (A) CLEAR   Specific Gravity, Urine 1.014 1.005 - 1.030   pH 5.0 5.0 - 8.0   Glucose, UA NEGATIVE NEGATIVE mg/dL   Hgb urine dipstick NEGATIVE NEGATIVE   Bilirubin Urine NEGATIVE NEGATIVE   Ketones, ur NEGATIVE NEGATIVE mg/dL   Protein, ur NEGATIVE NEGATIVE mg/dL   Nitrite NEGATIVE NEGATIVE   Leukocytes,Ua LARGE (A) NEGATIVE   RBC / HPF 6-10 0 - 5 RBC/hpf   WBC, UA 21-50 0 - 5 WBC/hpf   Bacteria, UA MANY  (A) NONE SEEN   Squamous Epithelial / LPF 11-20 0 - 5   WBC Clumps PRESENT    Mucus PRESENT    Hyaline Casts, UA PRESENT     Comment: Performed at [facility], [place] [address], [place], [place] [address]   DG Chest 2 View  Result Date: 02/27/2020 CLINICAL DATA:  Malaise and failure to thrive. EXAM: CHEST - 2 VIEW COMPARISON:  01/15/2017 and prior chest radiographs FINDINGS: The cardiomediastinal silhouette is unremarkable. There is no evidence of focal airspace disease, pulmonary edema, suspicious pulmonary nodule/mass, pleural effusion, or pneumothorax. No acute bony abnormalities are identified. IMPRESSION: No active cardiopulmonary disease. Electronically Signed   By: Margarette Canada M.D.   On: 02/27/2020 12:26   CT Head Wo Contrast  Result Date: 02/27/2020 CLINICAL DATA:  86 year old female with altered mental status. EXAM: CT HEAD WITHOUT CONTRAST TECHNIQUE: Contiguous axial images were obtained from the base of the skull through the vertex without intravenous contrast. COMPARISON:  02/19/2008 CT FINDINGS: Brain: No evidence of acute infarction, hemorrhage, hydrocephalus, extra-axial collection or mass lesion/mass effect. Atrophy and mild chronic small-vessel white matter ischemic changes again noted. Vascular: No hyperdense vessel or unexpected calcification. Carotid atherosclerotic calcifications are noted. Skull: Normal. Negative for fracture or focal lesion. Sinuses/Orbits: No acute finding. Other: None. IMPRESSION: 1. No evidence of acute intracranial abnormality. 2. Atrophy and chronic small-vessel white matter ischemic changes. Electronically Signed   By: Margarette Canada M.D.   On: 02/27/2020 13:02    Review of Systems  Constitutional: Positive for appetite change, chills and fatigue. Negative for fever.  Eyes: Negative for discharge.  Respiratory: Negative for cough, chest tightness and shortness of breath.   Cardiovascular: Negative for chest pain, palpitations and leg swelling.   Gastrointestinal: Negative for abdominal distention and abdominal pain.  Genitourinary: Negative for difficulty urinating, dysuria and flank pain.  Neurological: Positive for dizziness.    Blood pressure 97/73, pulse 71, temperature 97.7 F (36.5 C), temperature source Oral, resp. rate 19, height 5\' 6"  (1.676 m), SpO2 100 %. Physical Exam Constitutional:      Appearance: Normal appearance.  HENT:     Head: Normocephalic and atraumatic.  Eyes:     Extraocular Movements: Extraocular movements intact.     Pupils: Pupils are equal, round, and reactive to light.  Neck:     Vascular: No carotid bruit.  Cardiovascular:     Rate and Rhythm: Normal rate and regular rhythm.     Heart sounds: Murmur (2/6  systolic murmur noted) heard.   Pulmonary:     Effort: Pulmonary effort is normal.     Breath sounds: Normal breath sounds. No wheezing, rhonchi or rales.  Abdominal:     General: Abdomen is flat. Bowel sounds are normal.     Palpations: Abdomen is soft.     Tenderness: There is no abdominal tenderness. There is no guarding.  Musculoskeletal:        General: No swelling or tenderness.     Cervical back: Neck supple.     Right lower leg: No edema.     Left lower leg: No edema.  Skin:    General: Skin is dry.  Neurological:     General: No focal deficit present.     Mental Status: She is alert and oriented to person, place, and time.      Assessment/Plan UTI Hypertension Hyperlipidemia Acute on chronic kidney injury Dehydration Degenerative joint disease Questionable  septal MI age undetermined Minimally elevated high-sensitivity troponin I secondary to above/acute on chronic kidney injury/hypotension Plan As per orders Dr. Doylene Canard on call for weekend Charolette Forward, MD 02/27/2020, 2:23 PM

## 2020-02-27 NOTE — Progress Notes (Signed)
ADmitted to room 6N5 from ED accompanied by Son. Patient is alert and oriented, not in any distress. Oriented to unit and staff. VSS. Will endorse appropriately.

## 2020-02-27 NOTE — Care Management (Signed)
ED CM discussed patient  with Dr. Eulis Foster, ED evaluation still in progress. CM will continue to follow for assistance with transitional care planning.

## 2020-02-27 NOTE — ED Notes (Signed)
Patient transported to X-ray 

## 2020-02-28 LAB — CBC
HCT: 34.9 % — ABNORMAL LOW (ref 36.0–46.0)
Hemoglobin: 11.4 g/dL — ABNORMAL LOW (ref 12.0–15.0)
MCH: 28.6 pg (ref 26.0–34.0)
MCHC: 32.7 g/dL (ref 30.0–36.0)
MCV: 87.7 fL (ref 80.0–100.0)
Platelets: 184 10*3/uL (ref 150–400)
RBC: 3.98 MIL/uL (ref 3.87–5.11)
RDW: 14.1 % (ref 11.5–15.5)
WBC: 5 10*3/uL (ref 4.0–10.5)
nRBC: 0 % (ref 0.0–0.2)

## 2020-02-28 LAB — URINE CULTURE: Culture: 20000 — AB

## 2020-02-28 NOTE — Progress Notes (Signed)
Ref: Charolette Forward, MD   Subjective:  T max 99.3 degree. Less confused today. Asking what was wrong with her.  Objective:  Vital Signs in the last 24 hours: Temp:  [97.8 F (36.6 C)-99.3 F (37.4 C)] 99.3 F (37.4 C) (07/24 0457) Pulse Rate:  [69-92] 89 (07/24 0457) Cardiac Rhythm: Normal sinus rhythm (07/24 0749) Resp:  [16-23] 18 (07/24 0457) BP: (97-151)/(40-73) 151/60 (07/24 0457) SpO2:  [98 %-100 %] 98 % (07/24 0457)  Physical Exam: BP Readings from Last 1 Encounters:  02/28/20 (!) 151/60     Wt Readings from Last 1 Encounters:  01/15/17 81.6 kg    Weight change:  Body mass index is 29.05 kg/m. HEENT: [place]/AT, Eyes-Brown, PERL, EOMI, Conjunctiva-Pink, Sclera-Non-icteric Neck: No JVD, No bruit, Trachea midline. Lungs:  Clear, Bilateral. Cardiac:  Regular rhythm, normal S1 and S2, no S3. II/VI systolic murmur. Abdomen:  Soft, non-tender. BS present. Extremities:  No edema present. No cyanosis. No clubbing. CNS: AxOx1, Cranial nerves grossly intact, moves all 4 extremities.  Skin: Warm and dry.   Intake/Output from previous day: 07/23 0701 - 07/24 0700 In: 1249.1 [P.O.:240; I.V.:1009.1] Out: -     Lab Results: BMET    Component Value Date/Time   NA 138 02/27/2020 0831   NA 142 01/15/2017 1920   K 3.7 02/27/2020 0831   K 3.0 (L) 01/15/2017 1920   CL 101 02/27/2020 0831   CL 101 01/15/2017 1920   CO2 21 (L) 02/27/2020 0831   CO2 30 01/15/2017 1920   GLUCOSE 120 (H) 02/27/2020 0831   GLUCOSE 105 (H) 01/15/2017 1920   BUN 45 (H) 02/27/2020 0831   BUN 18 01/15/2017 1920   CREATININE 2.74 (H) 02/27/2020 1359   CREATININE 2.94 (H) 02/27/2020 0831   CREATININE 1.19 (H) 01/15/2017 1920   CALCIUM 10.4 (H) 02/27/2020 0831   CALCIUM 10.4 (H) 01/15/2017 1920   GFRNONAA 15 (L) 02/27/2020 1359   GFRNONAA 13 (L) 02/27/2020 0831   GFRNONAA 40 (L) 01/15/2017 1920   GFRAA 17 (L) 02/27/2020 1359   GFRAA 15 (L) 02/27/2020 0831   GFRAA 46 (L) 01/15/2017 1920   CBC     Component Value Date/Time   WBC 7.0 02/27/2020 0831   RBC 5.04 02/27/2020 0831   HGB 14.2 02/27/2020 0831   HCT 44.7 02/27/2020 0831   PLT 272 02/27/2020 0831   MCV 88.7 02/27/2020 0831   MCH 28.2 02/27/2020 0831   MCHC 31.8 02/27/2020 0831   RDW 13.9 02/27/2020 0831   HEPATIC Function Panel No results for input(s): PROT in the last 8760 hours.  Invalid input(s):  ALBUMIN,  AST,  ALT,  ALKPHOS,  BILIDIR,  IBILI HEMOGLOBIN A1C No components found for: HGA1C,  MPG CARDIAC ENZYMES No results found for: CKTOTAL, CKMB, CKMBINDEX, TROPONINI BNP No results for input(s): PROBNP in the last 8760 hours. TSH No results for input(s): TSH in the last 8760 hours. CHOLESTEROL No results for input(s): CHOL in the last 8760 hours.  Scheduled Meds: . aspirin EC  81 mg Oral Daily  . heparin  5,000 Units Subcutaneous Q8H  . metoprolol succinate  100 mg Oral Daily  . pantoprazole  40 mg Oral Q0600  . sodium chloride flush  3 mL Intravenous Once   Continuous Infusions: . sodium chloride 100 mL/hr at 02/28/20 0642  . cefTRIAXone (ROCEPHIN)  IV     PRN Meds:.  Assessment/Plan: UTI with urosepsis Dehydration, improving Acute on chronic kidney injury Hypertension Hyperlipidemia Possible old MI Abnormal troponin I  from CKD  Decrease IV fluids. Increase oral intake. Continue IV antibiotics Recheck labs in AM. Increase activity as tolerated.   LOS: 1 day   Time spent including chart review, lab review, examination, discussion with patient and tech : 30 min   [name]  MD  02/28/2020, 10:21 AM

## 2020-02-28 NOTE — Plan of Care (Signed)

## 2020-02-29 LAB — COMPREHENSIVE METABOLIC PANEL
ALT: 18 U/L (ref 0–44)
AST: 20 U/L (ref 15–41)
Albumin: 2.8 g/dL — ABNORMAL LOW (ref 3.5–5.0)
Alkaline Phosphatase: 49 U/L (ref 38–126)
Anion gap: 10 (ref 5–15)
BUN: 36 mg/dL — ABNORMAL HIGH (ref 8–23)
CO2: 24 mmol/L (ref 22–32)
Calcium: 9.2 mg/dL (ref 8.9–10.3)
Chloride: 109 mmol/L (ref 98–111)
Creatinine, Ser: 2.17 mg/dL — ABNORMAL HIGH (ref 0.44–1.00)
GFR calc Af Amer: 22 mL/min — ABNORMAL LOW (ref >60)
GFR calc non Af Amer: 19 mL/min — ABNORMAL LOW (ref >60)
Glucose, Bld: 110 mg/dL — ABNORMAL HIGH (ref 70–99)
Potassium: 4 mmol/L (ref 3.5–5.1)
Sodium: 143 mmol/L (ref 135–145)
Total Bilirubin: 0.4 mg/dL (ref 0.3–1.2)
Total Protein: 5.5 g/dL — ABNORMAL LOW (ref 6.5–8.1)

## 2020-02-29 LAB — CBC
HCT: 36.3 % (ref 36.0–46.0)
Hemoglobin: 11.8 g/dL — ABNORMAL LOW (ref 12.0–15.0)
MCH: 28 pg (ref 26.0–34.0)
MCHC: 32.5 g/dL (ref 30.0–36.0)
MCV: 86.2 fL (ref 80.0–100.0)
Platelets: 183 10*3/uL (ref 150–400)
RBC: 4.21 MIL/uL (ref 3.87–5.11)
RDW: 14.1 % (ref 11.5–15.5)
WBC: 6.1 10*3/uL (ref 4.0–10.5)
nRBC: 0 % (ref 0.0–0.2)

## 2020-02-29 LAB — IRON AND TIBC
Iron: 52 ug/dL (ref 28–170)
Saturation Ratios: 22 % (ref 10.4–31.8)
TIBC: 235 ug/dL — ABNORMAL LOW (ref 250–450)
UIBC: 183 ug/dL

## 2020-02-29 LAB — HEMOGLOBIN A1C
Hgb A1c MFr Bld: 5.9 % — ABNORMAL HIGH (ref 4.8–5.6)
Mean Plasma Glucose: 122.63 mg/dL

## 2020-02-29 MED ORDER — AMLODIPINE BESYLATE 5 MG PO TABS
5.0000 mg | ORAL_TABLET | Freq: Every day | ORAL | Status: DC
  Administered 2020-02-29 – 2020-03-03 (×4): 5 mg via ORAL
  Filled 2020-02-29 (×4): qty 1

## 2020-02-29 NOTE — Progress Notes (Signed)
Ref: Charolette Forward, MD   Subjective:  Afebrile. Mild confusion persist. Improving creatinine, now 2.17. Elevated systolic blood pressure.   Objective:  Vital Signs in the last 24 hours: Temp:  [97.6 F (36.4 C)-98.8 F (37.1 C)] 98.7 F (37.1 C) (07/25 0552) Pulse Rate:  [78-82] 78 (07/25 0552) Cardiac Rhythm: Normal sinus rhythm (07/24 1900) Resp:  [17-18] 17 (07/25 0552) BP: (138-178)/(55-65) 178/65 (07/25 0552) SpO2:  [99 %] 99 % (07/25 0552)  Physical Exam: BP Readings from Last 1 Encounters:  02/29/20 (!) 178/65     Wt Readings from Last 1 Encounters:  01/15/17 81.6 kg    Weight change:  Body mass index is 29.05 kg/m. HEENT: [place]/AT, Eyes-Brown, PERL, EOMI, Conjunctiva-Pink, Sclera-Non-icteric Neck: No JVD, No bruit, Trachea midline. Lungs:  Clear, Bilateral. Cardiac:  Regular rhythm, normal S1 and S2, no S3. II/VI systolic murmur. Abdomen:  Soft, non-tender. BS present. Extremities:  No edema present. No cyanosis. No clubbing. CNS: AxOx1, Cranial nerves grossly intact, moves all 4 extremities.  Skin: Warm and dry.   Intake/Output from previous day: 07/24 0701 - 07/25 0700 In: 1460.2 [P.O.:660; I.V.:700.2; IV Piggyback:100] Out: 1500 [Urine:1500]    Lab Results: BMET    Component Value Date/Time   NA 143 02/29/2020 0152   NA 138 02/27/2020 0831   NA 142 01/15/2017 1920   K 4.0 02/29/2020 0152   K 3.7 02/27/2020 0831   K 3.0 (L) 01/15/2017 1920   CL 109 02/29/2020 0152   CL 101 02/27/2020 0831   CL 101 01/15/2017 1920   CO2 24 02/29/2020 0152   CO2 21 (L) 02/27/2020 0831   CO2 30 01/15/2017 1920   GLUCOSE 110 (H) 02/29/2020 0152   GLUCOSE 120 (H) 02/27/2020 0831   GLUCOSE 105 (H) 01/15/2017 1920   BUN 36 (H) 02/29/2020 0152   BUN 45 (H) 02/27/2020 0831   BUN 18 01/15/2017 1920   CREATININE 2.17 (H) 02/29/2020 0152   CREATININE 2.74 (H) 02/27/2020 1359   CREATININE 2.94 (H) 02/27/2020 0831   CALCIUM 9.2 02/29/2020 0152   CALCIUM 10.4 (H)  02/27/2020 0831   CALCIUM 10.4 (H) 01/15/2017 1920   GFRNONAA 19 (L) 02/29/2020 0152   GFRNONAA 15 (L) 02/27/2020 1359   GFRNONAA 13 (L) 02/27/2020 0831   GFRAA 22 (L) 02/29/2020 0152   GFRAA 17 (L) 02/27/2020 1359   GFRAA 15 (L) 02/27/2020 0831   CBC    Component Value Date/Time   WBC 6.1 02/29/2020 0152   RBC 4.21 02/29/2020 0152   HGB 11.8 (L) 02/29/2020 0152   HCT 36.3 02/29/2020 0152   PLT 183 02/29/2020 0152   MCV 86.2 02/29/2020 0152   MCH 28.0 02/29/2020 0152   MCHC 32.5 02/29/2020 0152   RDW 14.1 02/29/2020 0152   HEPATIC Function Panel Recent Labs    02/29/20 0152  PROT 5.5*   HEMOGLOBIN A1C No components found for: HGA1C,  MPG CARDIAC ENZYMES No results found for: CKTOTAL, CKMB, CKMBINDEX, TROPONINI BNP No results for input(s): PROBNP in the last 8760 hours. TSH No results for input(s): TSH in the last 8760 hours. CHOLESTEROL No results for input(s): CHOL in the last 8760 hours.  Scheduled Meds: . amLODipine  5 mg Oral Daily  . aspirin EC  81 mg Oral Daily  . heparin  5,000 Units Subcutaneous Q8H  . metoprolol succinate  100 mg Oral Daily  . pantoprazole  40 mg Oral Q0600  . sodium chloride flush  3 mL Intravenous Once   Continuous  Infusions: . sodium chloride 50 mL/hr at 02/29/20 0850  . cefTRIAXone (ROCEPHIN)  IV 1 g (02/28/20 1406)   PRN Meds:.  Assessment/Plan:   UTI with urosepsis Dehydration, improving Acute on chronic kidney disease, improving Senile dementia Hypertension Hyperlipidemia Possible old MI Abnormal troponin from CKD, IV Mild protein calorie malnutrition Hypoalbuminemia  Resume amlodipine. Increase activity   LOS: 2 days   Time spent including chart review, lab review, examination, discussion with patient : 30 min   [name]  MD  02/29/2020, 9:44 AM

## 2020-03-01 LAB — URINALYSIS, ROUTINE W REFLEX MICROSCOPIC
Bilirubin Urine: NEGATIVE
Glucose, UA: NEGATIVE mg/dL
Hgb urine dipstick: NEGATIVE
Ketones, ur: NEGATIVE mg/dL
Leukocytes,Ua: NEGATIVE
Nitrite: NEGATIVE
Protein, ur: NEGATIVE mg/dL
Specific Gravity, Urine: 1.01 (ref 1.005–1.030)
pH: 5 (ref 5.0–8.0)

## 2020-03-01 NOTE — NC FL2 (Signed)
  [place] LEVEL OF CARE SCREENING TOOL     IDENTIFICATION  Patient Name: Robin Hobbs Birthdate: Apr 26, 1926 Sex: female Admission Date (Current Location): 02/27/2020  [facility] and [place] Number:  Herbalist and Address:  The . [facility] [address], [place], [place] [address]      Provider Number: [id number]  Attending Physician Name and Address:  Charolette Forward, MD  Relative Name and Phone Number:       Current Level of Care: Hospital Recommended Level of Care: [place] Prior Approval Number:    Date Approved/Denied:   PASRR Number: [id number]  Discharge Plan: SNF    Current Diagnoses: Patient Active Problem List   Diagnosis Date Noted  . UTI (urinary tract infection) 02/27/2020    Orientation RESPIRATION BLADDER Height & Weight     Self, Time, Situation, Place  Normal Continent Weight:   Height:  5\' 6"  (167.6 cm)  BEHAVIORAL SYMPTOMS/MOOD NEUROLOGICAL BOWEL NUTRITION STATUS      Continent Diet  AMBULATORY STATUS COMMUNICATION OF NEEDS Skin   Limited Assist Verbally Normal                       Personal Care Assistance Level of Assistance  Bathing, Feeding, Dressing Bathing Assistance: Limited assistance Feeding assistance: Independent Dressing Assistance: Limited assistance     Functional Limitations Info  Sight, Hearing, Speech Sight Info: Adequate Hearing Info: Adequate Speech Info: Adequate    SPECIAL CARE FACTORS FREQUENCY  PT (By licensed PT), OT (By licensed OT)     PT Frequency: five times a week OT Frequency: five times a week            Contractures Contractures Info: Not present    Additional Factors Info  Code Status Code Status Info: full code             Current Medications (03/01/2020):  This is the current hospital active medication list Current Facility-Administered Medications  Medication Dose Route Frequency Provider Last Rate Last Admin  .  0.9 %  sodium chloride infusion   Intravenous Continuous [name], MD 30 mL/hr at 03/01/20 1454 New Bag at 03/01/20 1454  . amLODipine (NORVASC) tablet 5 mg  5 mg Oral Daily [name], MD   5 mg at 03/01/20 8469  . aspirin EC tablet 81 mg  81 mg Oral Daily Charolette Forward, MD   81 mg at 03/01/20 6295  . cefTRIAXone (ROCEPHIN) 1 g in sodium chloride 0.9 % 100 mL IVPB  1 g Intravenous Q24H Charolette Forward, MD 200 mL/hr at 03/01/20 1415 1 g at 03/01/20 1415  . heparin injection 5,000 Units  5,000 Units Subcutaneous Q8H Charolette Forward, MD   5,000 Units at 03/01/20 1417  . metoprolol succinate (TOPROL-XL) 24 hr tablet 100 mg  100 mg Oral Daily Charolette Forward, MD   100 mg at 03/01/20 2841  . pantoprazole (PROTONIX) EC tablet 40 mg  40 mg Oral Q0600 Charolette Forward, MD   40 mg at 03/01/20 0533  . sodium chloride flush (NS) 0.9 % injection 3 mL  3 mL Intravenous Once Daleen Bo, MD         Discharge Medications: Please see discharge summary for a list of discharge medications.  Relevant Imaging Results:  Relevant Lab Results:   Additional Information SSI [phone] , No known allergies , DX UTI and dehydration  Jalicia Roszak, Edson Snowball, RN

## 2020-03-01 NOTE — Progress Notes (Signed)
Subjective:  Patient denies any chest pain or shortness of breath.  States appetite remains poor feels weak and tired.  Denies any urinary complaints.  Objective:  Vital Signs in the last 24 hours: Temp:  [98.2 F (36.8 C)-98.7 F (37.1 C)] 98.4 F (36.9 C) (07/26 1250) Pulse Rate:  [77-87] 80 (07/26 1250) Resp:  [17-19] 17 (07/26 1250) BP: (139-158)/(61-89) 139/64 (07/26 1250) SpO2:  [98 %-99 %] 98 % (07/26 1250)  Intake/Output from previous day: 07/25 0701 - 07/26 0700 In: 1300 [P.O.:600; I.V.:500; IV Piggyback:200] Out: 1850 [Urine:1850] Intake/Output from this shift: Total I/O In: 240 [P.O.:240] Out: -   Physical Exam: Neck: no adenopathy, no carotid bruit, no JVD and supple, symmetrical, trachea midline Lungs: clear to auscultation bilaterally Heart: regularly irregular rhythm, S1, S2 normal and soft systolic murmur noted Abdomen: soft, non-tender; bowel sounds normal; no masses,  no organomegaly Extremities: extremities normal, atraumatic, no cyanosis or edema  Lab Results: Recent Labs    02/28/20 1614 02/29/20 0152  WBC 5.0 6.1  HGB 11.4* 11.8*  PLT 184 183   Recent Labs    02/29/20 0152  NA 143  K 4.0  CL 109  CO2 24  GLUCOSE 110*  BUN 36*  CREATININE 2.17*   No results for input(s): TROPONINI in the last 72 hours.  Invalid input(s): CK, MB Hepatic Function Panel Recent Labs    02/29/20 0152  PROT 5.5*  ALBUMIN 2.8*  AST 20  ALT 18  ALKPHOS 49  BILITOT 0.4   No results for input(s): CHOL in the last 72 hours. No results for input(s): PROTIME in the last 72 hours.  Imaging: Imaging results have been reviewed and No results found.  Cardiac Studies:  Assessment/Plan:  UTI with urosepsis Dehydration, improving Acute on chronic kidney disease, improving Senile dementia Hypertension Hyperlipidemia Possible old MI Abnormal troponin from CKD, IV Mild protein calorie malnutrition Hypoalbuminemia plan Continue present  management. Increase ambulation as tolerated. Social service for discharge planning. Check labs in a.m.  LOS: 3 days    Charolette Forward 03/01/2020, 2:18 PM

## 2020-03-01 NOTE — Care Management Important Message (Signed)
Important Message  Patient Details  Name: Robin Hobbs MRN: [id number] Date of Birth: [birth year]   Medicare Important Message Given:  Yes     Orbie Pyo 03/01/2020, 3:08 PM

## 2020-03-01 NOTE — TOC Initial Note (Signed)
Transition of Care [facility] - Initial/Assessment Note    Patient Details  Name: PENDA VENTURI MRN: [id number] Date of Birth: 23-Jul-1926  Transition [facility]) CM/SW Contact:    Marilu Favre, RN Phone Number: 03/01/2020, 4:14 PM  Clinical Narrative:                  Discussed discharge planning with patient at bedside. PT recommendation HHPT with supervision and walker. Patient lives alone but believes her son could stay with her at discharge. Patient consented for NCM to call son Ellwood Sayers [phone] , called and left message with direct call back. Patient asked NCM to try her home phone [phone] , he might be at her place. Called same no answer and no message system.   NCM discussed SNF with patient for short term rehab. Patient will think about it tonight and discuss with her son this evening. She does not believe her son would want her to go to SNF.   Patient did agree for NCM to complete FL2 and fax FL2. NCM will follow up with patient in morning and await son to return call.    Barriers to Discharge: Continued Medical Work up   Patient Goals and CMS Choice Patient states their goals for this hospitalization and ongoing recovery are:: to return to home   Choice offered to / list presented to : Patient  Expected Discharge Plan and Services     Discharge Planning Services: CM Consult Post Acute Care Choice: [place] arrangements for the past 2 months: Single Family Home                                      Prior Living Arrangements/Services Living arrangements for the past 2 months: Single Family Home Lives with:: Self Patient language and need for interpreter reviewed:: Yes Do you feel safe going back to the place where you live?: Yes            Criminal Activity/Legal Involvement Pertinent to Current Situation/Hospitalization: No - Comment as needed  Activities of Daily Living      Permission Sought/Granted   Permission granted  to share information with : Yes, Verbal Permission Granted  Share Information with NAME: Alexandrina Fiorini [phone] son           Emotional Assessment Appearance:: Appears younger than stated age Attitude/Demeanor/Rapport: Engaged Affect (typically observed): Accepting Orientation: : Oriented to Self, Oriented to Place, Oriented to  Time, Oriented to Situation Alcohol / Substance Use: Not Applicable Psych Involvement: No (comment)  Admission diagnosis:  UTI (urinary tract infection) [N39.0] Elevated troponin [R77.8] AKI (acute kidney injury) ([place]) [N17.9] Urinary tract infection without hematuria, site unspecified [N39.0] Patient Active Problem List   Diagnosis Date Noted  . UTI (urinary tract infection) 02/27/2020   PCP:  Charolette Forward, MD Pharmacy:   [id number] [address], [place] [address] Phone: [phone] Fax: [phone]     Social Determinants of Health (SDOH) Interventions    Readmission Risk Interventions No flowsheet data found.

## 2020-03-01 NOTE — Plan of Care (Signed)
  Problem: Education: Goal: Knowledge of General Education information will improve Description Including pain rating scale, medication(s)/side effects and non-pharmacologic comfort measures Outcome: Progressing   

## 2020-03-01 NOTE — Evaluation (Signed)
Physical Therapy Evaluation Patient Details Name: Robin Hobbs MRN: [id number] DOB: 1926/02/10 Today's Date: 03/01/2020   History of Present Illness  Patient is a 86 y/o female who presents with lethargy, decreased PO intake and failure to thrive. Admitted with acute renal failure and urosepsis. PMH includes HTN.  Clinical Impression  Patient presents with generalized weakness, impaired balance and impaired mobility s/p above. Pt lives alone and reports being independent for ADls and ambulation PTA. Reports son helps with grocery shopping. Pt foes not drive and denies falls. Today, pt tolerated bed mobility, transfers and gait training with Min guard and 1 instance of Min A due to LOB backwards during transfer. Encouraged increasing activity while in the hospital. Likely will do well with RW however pt reluctant. Will follow acutely to maximize independence and mobility prior to return home.    Follow Up Recommendations Home health PT;Supervision for mobility/OOB    Equipment Recommendations  Rolling walker with 5" wheels (pending progress)    Recommendations for Other Services       Precautions / Restrictions Precautions Precautions: Fall Restrictions Weight Bearing Restrictions: No      Mobility  Bed Mobility Overal bed mobility: Needs Assistance Bed Mobility: Supine to Sit     Supine to sit: Supervision;HOB elevated     General bed mobility comments: No assist needed, dizziness upon sitting.  Transfers Overall transfer level: Needs assistance Equipment used: None Transfers: Sit to/from [facility] Sit to Stand: Min guard Stand pivot transfers: Min assist       General transfer comment: Min guard for safety. Stood from EOB x1, SPT bed to [facility] x1, stood from [facility] x1, transferred to chair post ambulation. 1 LOB posteriorly during SPT requiring assist.  Ambulation/Gait Ambulation/Gait assistance: Min guard Gait Distance (Feet): 40 Feet Assistive  device: IV Pole Gait Pattern/deviations: Step-through pattern;Decreased stride length Gait velocity: decreased   General Gait Details: Slow, mostly steady gait with UE support, reaching for counter for BUE support at times.  Stairs            Wheelchair Mobility    Modified Rankin (Stroke Patients Only)       Balance Overall balance assessment: Needs assistance Sitting-balance support: Feet supported;Single extremity supported Sitting balance-Leahy Scale: Fair Sitting balance - Comments: supervision for safety.   Standing balance support: During functional activity Standing balance-Leahy Scale: Poor Standing balance comment: Requires UE support but likely will do better with BUE support as pt reaching for additional support during walking. Does notw ant to use RW.                             Pertinent Vitals/Pain Pain Assessment: No/denies pain    Home Living Family/patient expects to be discharged to:: Private residence Living Arrangements: Alone Available Help at Discharge: Family;Available PRN/intermittently Type of Home: House Home Access: Stairs to enter   [employer] of Steps: a few I think Home Layout: Multi-level Home Equipment: Shower seat      Prior Function Level of Independence: Independent         Comments: Does her own ADLs/IADLs. Son helps with grocery shopping. Has someone present for bathing. Does not drive. Reports no fall. Question reliability as pt hesitant with certain answers and changes them during sesison?     Hand Dominance   Dominant Hand: Left    Extremity/Trunk Assessment   Upper Extremity Assessment Upper Extremity Assessment: Defer to OT evaluation  Lower Extremity Assessment Lower Extremity Assessment: Generalized weakness (but functional, grossly ~4/5 throughout.)       Communication   Communication: No difficulties  Cognition Arousal/Alertness: Awake/alert Behavior During Therapy: WFL  for tasks assessed/performed Overall Cognitive Status: No family/caregiver present to determine baseline cognitive functioning                                 General Comments: Oriented to person and place but not to situation. "I don't remember." Impaired STM. Tangential at times. Follows simple commands.      General Comments General comments (skin integrity, edema, etc.): Incontinent of urine during mobility. Assisted wtih clean up.    Exercises     Assessment/Plan    PT Assessment Patient needs continued PT services  PT Problem List Decreased strength;Decreased mobility;Decreased safety awareness;Decreased cognition;Decreased balance;Decreased knowledge of use of DME       PT Treatment Interventions Therapeutic activities;DME instruction;Gait training;Therapeutic exercise;Patient/family education;Balance training;Stair training;Functional mobility training    PT Goals (Current goals can be found in the Care Plan section)  Acute Rehab PT Goals Patient Stated Goal: to get home and be independent for as long as possible PT Goal Formulation: With patient Time For Goal Achievement: 03/15/20 Potential to Achieve Goals: Good    Frequency Min 3X/week   Barriers to discharge Decreased caregiver support lives alone    Co-evaluation               AM-PAC PT "6 Clicks" Mobility  Outcome Measure Help needed turning from your back to your side while in a flat bed without using bedrails?: None Help needed moving from lying on your back to sitting on the side of a flat bed without using bedrails?: A Little Help needed moving to and from a bed to a chair (including a wheelchair)?: A Little Help needed standing up from a chair using your arms (e.g., wheelchair or bedside chair)?: A Little Help needed to walk in hospital room?: A Little Help needed climbing 3-5 steps with a railing? : A Lot 6 Click Score: 18    End of Session Equipment Utilized During Treatment:  Gait belt Activity Tolerance: Patient tolerated treatment well Patient left: in chair;with call bell/phone within reach;with chair alarm set Nurse Communication: Mobility status PT Visit Diagnosis: Unsteadiness on feet (R26.81);Muscle weakness (generalized) (M62.81)    Time: 6948-5462 PT Time Calculation (min) (ACUTE ONLY): 24 min   Charges:   PT Evaluation $PT Eval Moderate Complexity: 1 Mod PT Treatments $Therapeutic Activity: 8-22 mins        Marisa Severin, PT, DPT Acute Rehabilitation Services Pager [phone] Office [phone]      Marguarite Arbour A Sabra Heck 03/01/2020, 8:55 AM

## 2020-03-01 NOTE — Plan of Care (Signed)

## 2020-03-02 LAB — BASIC METABOLIC PANEL
Anion gap: 8 (ref 5–15)
BUN: 24 mg/dL — ABNORMAL HIGH (ref 8–23)
CO2: 23 mmol/L (ref 22–32)
Calcium: 9.2 mg/dL (ref 8.9–10.3)
Chloride: 112 mmol/L — ABNORMAL HIGH (ref 98–111)
Creatinine, Ser: 1.6 mg/dL — ABNORMAL HIGH (ref 0.44–1.00)
GFR calc Af Amer: 32 mL/min — ABNORMAL LOW (ref >60)
GFR calc non Af Amer: 28 mL/min — ABNORMAL LOW (ref >60)
Glucose, Bld: 92 mg/dL (ref 70–99)
Potassium: 3.8 mmol/L (ref 3.5–5.1)
Sodium: 143 mmol/L (ref 135–145)

## 2020-03-02 LAB — CBC
HCT: 36.3 % (ref 36.0–46.0)
Hemoglobin: 11.8 g/dL — ABNORMAL LOW (ref 12.0–15.0)
MCH: 28.6 pg (ref 26.0–34.0)
MCHC: 32.5 g/dL (ref 30.0–36.0)
MCV: 88.1 fL (ref 80.0–100.0)
Platelets: 172 10*3/uL (ref 150–400)
RBC: 4.12 MIL/uL (ref 3.87–5.11)
RDW: 14.5 % (ref 11.5–15.5)
WBC: 6.6 10*3/uL (ref 4.0–10.5)
nRBC: 0 % (ref 0.0–0.2)

## 2020-03-02 NOTE — TOC Progression Note (Addendum)
Transition of Care [facility]) - Progression Note    Patient Details  Name: Robin Hobbs MRN: [id number] Date of Birth: 28-Mar-1926  [facility] CM/SW Contact  Jacalyn Lefevre Edson Snowball, RN Phone Number: 03/02/2020, 8:50 AM  Clinical Narrative:      Patient's son Robin Hobbs returned call. Discussed home health PT vs SNF for short term rehab.    He is moving in with his mother and would like home health PT. No preference in agency. Will need home health orders and face to face.  Patient does not have any DME in the home. She will need a walker.  Will need order for walker    1000 Spoke to patient and son at bedside. Confirmed with both patient does want to go home at discharge with HHPT and walker. Son will move in with patient.   Will need orders  [place] with [facility] accepted for [place] with [place] for walker.  Barriers to Discharge: Continued Medical Work up  Expected Discharge Plan and Services     Discharge Planning Services: CM Consult Post Acute Care Choice: [place] arrangements for the past 2 months: Single Family Home                                       Social Determinants of Health (SDOH) Interventions    Readmission Risk Interventions No flowsheet data found.

## 2020-03-02 NOTE — Progress Notes (Signed)
Subjective:  Patient denies any chest pain or shortness of breath.  Objective:  Vital Signs in the last 24 hours: Temp:  [97.6 F (36.4 C)-98.4 F (36.9 C)] 97.6 F (36.4 C) (07/27 0509) Pulse Rate:  [80-84] 84 (07/27 0509) Resp:  [17] 17 (07/27 0509) BP: (139-169)/(64-85) 169/85 (07/27 0509) SpO2:  [98 %-99 %] 98 % (07/27 0509)  Intake/Output from previous day: 07/26 0701 - 07/27 0700 In: 240 [P.O.:240] Out: -  Intake/Output from this shift: No intake/output data recorded.  Physical Exam: Neck: no adenopathy, no carotid bruit, no JVD and supple, symmetrical, trachea midline Lungs: clear to auscultation bilaterally Heart: regular rate and rhythm, S1, S2 normal and soft systolic murmur noted.  No S3 gallop Abdomen: soft, non-tender; bowel sounds normal; no masses,  no organomegaly Extremities: extremities normal, atraumatic, no cyanosis or edema  Lab Results: Recent Labs    02/29/20 0152 03/02/20 0550  WBC 6.1 6.6  HGB 11.8* 11.8*  PLT 183 172   Recent Labs    02/29/20 0152 03/02/20 0550  NA 143 143  K 4.0 3.8  CL 109 112*  CO2 24 23  GLUCOSE 110* 92  BUN 36* 24*  CREATININE 2.17* 1.60*   No results for input(s): TROPONINI in the last 72 hours.  Invalid input(s): CK, MB Hepatic Function Panel Recent Labs    02/29/20 0152  PROT 5.5*  ALBUMIN 2.8*  AST 20  ALT 18  ALKPHOS 49  BILITOT 0.4   No results for input(s): CHOL in the last 72 hours. No results for input(s): PROTIME in the last 72 hours.  Imaging: Imaging results have been reviewed and No results found.  Cardiac Studies:  Assessment/Plan:  UTI with urosepsis Dehydration, improving Acute on chronic kidney disease, improving Senile dementia Hypertension Hyperlipidemia Possible old MI Abnormal troponin from CKD, IV Mild protein calorie malnutrition Hypoalbuminemia Plan Continue present management. Awaiting disposition. Increase ambulation as tolerated  LOS: 4 days    Charolette Forward 03/02/2020, 11:18 AM

## 2020-03-03 MED ORDER — ASPIRIN 81 MG PO TBEC: 81.0000 mg | DELAYED_RELEASE_TABLET | Freq: Every day | ORAL | 11 refills | Status: AC

## 2020-03-03 MED ORDER — PANTOPRAZOLE SODIUM 40 MG PO TBEC: 40.0000 mg | DELAYED_RELEASE_TABLET | Freq: Every day | ORAL | 1 refills | Status: AC

## 2020-03-03 NOTE — Discharge Summary (Signed)
NAME: Robin Hobbs, PLACIDE MEDICAL RECORD [id number] ACCOUNT [id number] DATE OF BIRTH:04/05/1926 FACILITY: [facility] LOCATION: [facility] PHYSICIAN:Courtne Lighty Daivd Council, MD  DISCHARGE SUMMARY  DATE OF DISCHARGE:  03/03/2020  ADMITTING DIAGNOSES: 1.  Urinary tract infection. 2.  Hypertension. 3.  Hyperlipidemia. 4.  Acute on chronic kidney injury. 5.  Dehydration. 6.  Degenerative joint disease. 7.  Questionable history of septal myocardial infarction -- age undetermined. 8.  Minimally elevated high sensitivity troponin I secondary to above/acute kidney injury.  FINAL DIAGNOSES: 1.  Status post urinary tract infection, hypertension. 2.  Hyperlipidemia. 3.  Status post acute on chronic kidney injury, now on baseline, status post dehydration. 4.  Degenerative joint disease. 5.  Coronary artery disease. 6.  Questionable history of septal myocardial infarction in the past, age undetermined. 7.  Mild dementia. 8.  Mild protein-calorie malnutrition. 9.  Hypoalbuminemia.  DISCHARGE HOME MEDICATIONS: 1.  Aspirin 81 mg 1 tablet daily. 2.  Protonix 40 mg daily. 3.  Amlodipine 5 mg daily. 4.  Metoprolol succinate 100 mg daily. 5.  The patient has been advised to stop clonidine and Klor-Con for now.  DIET:  Low salt, low cholesterol.  ACTIVITY:  As tolerated.  CONDITION AT DISCHARGE:  Stable.  Follow up with me in 1 week.  BRIEF HISTORY AND HOSPITAL COURSE:  The patient is a 86 year old female with past medical history significant for hypertension, hyperlipidemia, degenerative joint disease, chronic kidney disease stage II, was seen in my office yesterday because of  feeling tired, fatigued, lethargy, no energy, poor p.o. intake for the last 1 week.  The patient denies any chest pain, nausea, vomiting, diaphoresis.  Denies shortness of breath.  Denies fever or chills.  Denies any urinary complaints.  EKG done in my  office showed new septal Q-waves with no acute ST elevation as compared to prior  ECG.  The patient appeared to be dehydrated and was advised to stop spironolactone and hydrochlorothiazide.  The patient states since yesterday her condition did not improve  and felt some chills, so decided to come to the ED.  In ED, patient was noted to be dehydrated with acute renal injury and UTI.  High-sensitivity troponin I minimally elevated, which was attributed to questionable recent septal MI versus acute on  chronic kidney injury.  PHYSICAL EXAMINATION: GENERAL:  She was awake, alert. VITAL SIGNS:  Blood pressure was 97/73, pulse 71.  She was afebrile. HEENT:  Conjunctivae pink. NECK:  Supple, no JVD. LUNGS:  Clear to auscultation without rhonchi or rales. CARDIOVASCULAR:  S1, S2 was normal.  There was II/VI systolic murmur. ABDOMEN:  Soft.  Bowel sounds are present, nontender. EXTREMITIES:  There is no clubbing, cyanosis or edema.  LABORATORY DATA:  Sodium was 138, potassium 3.7, glucose was 120, repeat fasting sugar on 07/27 is 92, BUN was 45, creatinine was 2.94.  Repeat BUN on 07/25, was 36, creatinine 2.17, which is trending down.  On 07/27, BUN is 24, creatinine is 1.60, which  is back to baseline.  Her high sensitivity troponin I was 68.  Repeat was 49, which is trending down.  Her hemoglobin was 14.2, hematocrit 44.7, white count of 7.0.  Repeat hemoglobin is 11.8, hematocrit 36.3, white count of 6.6, which has been stable  for the last 3 days.  Urinalysis showed large leukocytes with many bacteria.  Urine culture grew 20,000 colonies per mL, multiple species were present.  Blood culture report results not available; probably not drawn in the ED.  CT of the brain showed  no  acute stroke.  BRIEF HOSPITAL COURSE:  The patient was admitted to regular floor, was started on IV Rocephin.  Urinalysis, urine culture and blood cultures were ordered.  The patient remained afebrile during the hospital stay.  Received Rocephin IV, which has been  discontinued.  The patient was slowly  hydrated with IV normal saline with marked improvement in her renal function.  The patient's appetite has improved.  PT consultation was called.  The patient has ambulated in room and hallway.  The patient and her  son were spoken with at length regarding short-term rehabilitation versus home health aide and discharging home with family.  Family agreed for home health aide and discharging home.  The patient will be discharged home on above medications and will be  followed up in my office in 1 week.  CN/NUANCE D:03/03/2020 T:03/03/2020 JOB:[id number]

## 2020-03-03 NOTE — Discharge Instructions (Signed)
Acute Kidney Injury, Adult  Acute kidney injury is a sudden worsening of kidney function. The kidneys are organs that have several jobs. They filter the blood to remove waste products and extra fluid. They also maintain a healthy balance of minerals and hormones in the body, which helps control blood pressure and keep bones strong. With this condition, your kidneys do not do their jobs as well as they should. This condition ranges from mild to severe. Over time it may develop into long-lasting (chronic) kidney disease. Early detection and treatment may prevent acute kidney injury from developing into a chronic condition. What are the causes? Common causes of this condition include:  A problem with blood flow to the kidneys. This may be caused by: ? Low blood pressure (hypotension) or shock. ? Blood loss. ? Heart and blood vessel (cardiovascular) disease. ? Severe burns. ? Liver disease.  Direct damage to the kidneys. This may be caused by: ? Certain medicines. ? A kidney infection. ? Poisoning. ? Being around or in contact with toxic substances. ? A surgical wound. ? A hard, direct hit to the kidney area.  A sudden blockage of urine flow. This may be caused by: ? Cancer. ? Kidney stones. ? An enlarged prostate in males. What are the signs or symptoms? Symptoms of this condition may not be obvious until the condition becomes severe. Symptoms of this condition can include:  Tiredness (lethargy), or difficulty staying awake.  Nausea or vomiting.  Swelling (edema) of the face, legs, ankles, or feet.  Problems with urination, such as: ? Abdominal pain, or pain along the side of your stomach (flank). ? Decreased urine production. ? Decrease in the force of urine flow.  Muscle twitches and cramps, especially in the legs.  Confusion or trouble concentrating.  Loss of appetite.  Fever. How is this diagnosed? This condition may be diagnosed with tests, including:  Blood  tests.  Urine tests.  Imaging tests.  A test in which a sample of tissue is removed from the kidneys to be examined under a microscope (kidney biopsy). How is this treated? Treatment for this condition depends on the cause and how severe the condition is. In mild cases, treatment may not be needed. The kidneys may heal on their own. In more severe cases, treatment will involve:  Treating the cause of the kidney injury. This may involve changing any medicines you are taking or adjusting your dosage.  Fluids. You may need specialized IV fluids to balance your body's needs.  Having a catheter placed to drain urine and prevent blockages.  Preventing problems from occurring. This may mean avoiding certain medicines or procedures that can cause further injury to the kidneys. In some cases treatment may also require:  A procedure to remove toxic wastes from the body (dialysis or continuous renal replacement therapy - CRRT).  Surgery. This may be done to repair a torn kidney, or to remove the blockage from the urinary system. Follow these instructions at home: Medicines  Take over-the-counter and prescription medicines only as told by your health care provider.  Do not take any new medicines without your health care provider's approval. Many medicines can worsen your kidney damage.  Do not take any vitamin and mineral supplements without your health care provider's approval. Many nutritional supplements can worsen your kidney damage. Lifestyle  If your health care provider prescribed changes to your diet, follow them. You may need to decrease the amount of protein you eat.  Achieve and maintain a healthy   weight. If you need help with this, ask your health care provider.  Start or continue an exercise plan. Try to exercise at least 30 minutes a day, 5 days a week.  Do not use any tobacco products, such as cigarettes, chewing tobacco, and e-cigarettes. If you need help quitting, ask your  health care provider. General instructions  Keep track of your blood pressure. Report changes in your blood pressure as told by your health care provider.  Stay up to date with immunizations. Ask your health care provider which immunizations you need.  Keep all follow-up visits as told by your health care provider. This is important. Where to find more information  American Association of Kidney Patients: BombTimer.gl  National Kidney Foundation: www.kidney.[place]: https://mathis.com/  [facility]: ? www.lifeoptions.org ? www.kidneyschool.org Contact a health care provider if:  Your symptoms get worse.  You develop new symptoms. Get help right away if:  You develop symptoms of worsening kidney disease, which include: ? Headaches. ? Abnormally dark or light skin. ? Easy bruising. ? Frequent hiccups. ? Chest pain. ? Shortness of breath. ? End of menstruation in women. ? Seizures. ? Confusion or altered mental status. ? Abdominal or back pain. ? Itchiness.  You have a fever.  Your body is producing less urine.  You have pain or bleeding when you urinate. Summary  Acute kidney injury is a sudden worsening of kidney function.  Acute kidney injury can be caused by problems with blood flow to the kidneys, direct damage to the kidneys, and sudden blockage of urine flow.  Symptoms of this condition may not be obvious until it becomes severe. Symptoms may include edema, lethargy, confusion, nausea or vomiting, and problems passing urine.  This condition can usually be diagnosed with blood tests, urine tests, and imaging tests. Sometimes a kidney biopsy is done to diagnose this condition.  Treatment for this condition often involves treating the underlying cause. It is treated with fluids, medicines, dialysis, diet changes, or surgery. This information is not intended to replace advice given to you by your health care provider. Make  sure you discuss any questions you have with your health care provider. Document Revised: 07/06/2017 Document Reviewed: 07/14/2016 Elsevier Patient Education  [place].   Urinary Tract Infection, Adult A urinary tract infection (UTI) is an infection of any part of the urinary tract. The urinary tract includes:  The kidneys.  The ureters.  The bladder.  The urethra. These organs make, store, and get rid of pee (urine) in the body. What are the causes? This is caused by germs (bacteria) in your genital area. These germs grow and cause swelling (inflammation) of your urinary tract. What increases the risk? You are more likely to develop this condition if:  You have a small, thin tube (catheter) to drain pee.  You cannot control when you pee or poop (incontinence).  You are female, and: ? You use these methods to prevent pregnancy:  A medicine that kills sperm (spermicide).  A device that blocks sperm (diaphragm). ? You have low levels of a female hormone (estrogen). ? You are pregnant.  You have genes that add to your risk.  You are sexually active.  You take antibiotic medicines.  You have trouble peeing because of: ? A prostate that is bigger than normal, if you are female. ? A blockage in the part of your body that drains pee from the bladder (urethra). ? A kidney stone. ? A nerve  condition that affects your bladder (neurogenic bladder). ? Not getting enough to drink. ? Not peeing often enough.  You have other conditions, such as: ? Diabetes. ? A weak disease-fighting system (immune system). ? Sickle cell disease. ? Gout. ? Injury of the spine. What are the signs or symptoms? Symptoms of this condition include:  Needing to pee right away (urgently).  Peeing often.  Peeing small amounts often.  Pain or burning when peeing.  Blood in the pee.  Pee that smells bad or not like normal.  Trouble peeing.  Pee that is cloudy.  Fluid coming from  the vagina, if you are female.  Pain in the belly or lower back. Other symptoms include:  Throwing up (vomiting).  No urge to eat.  Feeling mixed up (confused).  Being tired and grouchy (irritable).  A fever.  Watery poop (diarrhea). How is this treated? This condition may be treated with:  Antibiotic medicine.  Other medicines.  Drinking enough water. Follow these instructions at home:  Medicines  Take over-the-counter and prescription medicines only as told by your doctor.  If you were prescribed an antibiotic medicine, take it as told by your doctor. Do not stop taking it even if you start to feel better. General instructions  Make sure you: ? Pee until your bladder is empty. ? Do not hold pee for a long time. ? Empty your bladder after sex. ? Wipe from front to back after pooping if you are a female. Use each tissue one time when you wipe.  Drink enough fluid to keep your pee pale yellow.  Keep all follow-up visits as told by your doctor. This is important. Contact a doctor if:  You do not get better after 1-2 days.  Your symptoms go away and then come back. Get help right away if:  You have very bad back pain.  You have very bad pain in your lower belly.  You have a fever.  You are sick to your stomach (nauseous).  You are throwing up. Summary  A urinary tract infection (UTI) is an infection of any part of the urinary tract.  This condition is caused by germs in your genital area.  There are many risk factors for a UTI. These include having a small, thin tube to drain pee and not being able to control when you pee or poop.  Treatment includes antibiotic medicines for germs.  Drink enough fluid to keep your pee pale yellow. This information is not intended to replace advice given to you by your health care provider. Make sure you discuss any questions you have with your health care provider. Document Revised: 07/11/2018 Document Reviewed:  01/31/2018 Elsevier Patient Education  2020 [employer].

## 2020-03-03 NOTE — Progress Notes (Signed)
Pt discharged home with son in stable condition 

## 2020-03-03 NOTE — Plan of Care (Signed)
  Problem: Education: Goal: Knowledge of General Education information will improve Description Including pain rating scale, medication(s)/side effects and non-pharmacologic comfort measures Outcome: Progressing   

## 2020-03-03 NOTE — Discharge Summary (Signed)
Discharge summary dictated on 03/03/20, dictation number is [phone]

## 2020-05-18 ENCOUNTER — Other Ambulatory Visit: Payer: Self-pay

## 2020-05-18 ENCOUNTER — Ambulatory Visit: Payer: Medicare HMO | Attending: Internal Medicine

## 2020-05-18 DIAGNOSIS — Z23 Encounter for immunization: Secondary | ICD-10-CM

## 2020-05-18 NOTE — Progress Notes (Signed)
   Covid-19 Vaccination Clinic  Name:  Robin Hobbs    MRN: [id number] DOB: 1926/01/10  05/18/2020  Robin Hobbs was observed post Covid-19 immunization for 15 minutes without incident. She was provided with Vaccine Information Sheet and instruction to access the V-Safe system.   Robin Hobbs was instructed to call 911 with any severe reactions post vaccine: Marland Kitchen Difficulty breathing  . Swelling of face and throat  . A fast heartbeat  . A bad rash all over body  . Dizziness and weakness

## 2021-12-07 ENCOUNTER — Other Ambulatory Visit: Payer: Self-pay

## 2021-12-07 ENCOUNTER — Encounter (HOSPITAL_COMMUNITY): Payer: Self-pay

## 2021-12-07 ENCOUNTER — Emergency Department (HOSPITAL_COMMUNITY): Payer: Medicare HMO

## 2021-12-07 ENCOUNTER — Inpatient Hospital Stay (HOSPITAL_COMMUNITY)
Admission: EM | Admit: 2021-12-07 | Discharge: 2021-12-10 | DRG: 193 | Disposition: A | Discharge status: Home Health | Payer: Medicare HMO | Attending: Internal Medicine | Admitting: Internal Medicine

## 2021-12-07 DIAGNOSIS — M1990 Unspecified osteoarthritis, unspecified site: Secondary | ICD-10-CM | POA: Diagnosis present

## 2021-12-07 DIAGNOSIS — I272 Pulmonary hypertension, unspecified: Secondary | ICD-10-CM

## 2021-12-07 DIAGNOSIS — A419 Sepsis, unspecified organism: Secondary | ICD-10-CM | POA: Insufficient documentation

## 2021-12-07 DIAGNOSIS — R6510 Systemic inflammatory response syndrome (SIRS) of non-infectious origin without acute organ dysfunction: Secondary | ICD-10-CM

## 2021-12-07 DIAGNOSIS — E441 Mild protein-calorie malnutrition: Secondary | ICD-10-CM | POA: Diagnosis present

## 2021-12-07 DIAGNOSIS — E860 Dehydration: Secondary | ICD-10-CM | POA: Diagnosis present

## 2021-12-07 DIAGNOSIS — J189 Pneumonia, unspecified organism: Principal | ICD-10-CM

## 2021-12-07 DIAGNOSIS — E8809 Other disorders of plasma-protein metabolism, not elsewhere classified: Secondary | ICD-10-CM | POA: Diagnosis present

## 2021-12-07 DIAGNOSIS — I071 Rheumatic tricuspid insufficiency: Secondary | ICD-10-CM | POA: Diagnosis present

## 2021-12-07 DIAGNOSIS — Z79899 Other long term (current) drug therapy: Secondary | ICD-10-CM

## 2021-12-07 DIAGNOSIS — E872 Acidosis, unspecified: Secondary | ICD-10-CM | POA: Diagnosis present

## 2021-12-07 DIAGNOSIS — I248 Other forms of acute ischemic heart disease: Secondary | ICD-10-CM | POA: Diagnosis present

## 2021-12-07 DIAGNOSIS — Z20822 Contact with and (suspected) exposure to covid-19: Secondary | ICD-10-CM | POA: Diagnosis present

## 2021-12-07 DIAGNOSIS — I2510 Atherosclerotic heart disease of native coronary artery without angina pectoris: Secondary | ICD-10-CM | POA: Diagnosis present

## 2021-12-07 DIAGNOSIS — Z87891 Personal history of nicotine dependence: Secondary | ICD-10-CM

## 2021-12-07 DIAGNOSIS — K219 Gastro-esophageal reflux disease without esophagitis: Secondary | ICD-10-CM

## 2021-12-07 DIAGNOSIS — I493 Ventricular premature depolarization: Secondary | ICD-10-CM | POA: Diagnosis present

## 2021-12-07 DIAGNOSIS — E785 Hyperlipidemia, unspecified: Secondary | ICD-10-CM | POA: Diagnosis present

## 2021-12-07 DIAGNOSIS — R0902 Hypoxemia: Secondary | ICD-10-CM | POA: Diagnosis not present

## 2021-12-07 DIAGNOSIS — R6520 Severe sepsis without septic shock: Secondary | ICD-10-CM

## 2021-12-07 DIAGNOSIS — I509 Heart failure, unspecified: Secondary | ICD-10-CM

## 2021-12-07 DIAGNOSIS — I10 Essential (primary) hypertension: Secondary | ICD-10-CM | POA: Diagnosis not present

## 2021-12-07 DIAGNOSIS — F03A0 Unspecified dementia, mild, without behavioral disturbance, psychotic disturbance, mood disturbance, and anxiety: Secondary | ICD-10-CM | POA: Diagnosis present

## 2021-12-07 DIAGNOSIS — Z66 Do not resuscitate: Secondary | ICD-10-CM | POA: Diagnosis present

## 2021-12-07 DIAGNOSIS — J9601 Acute respiratory failure with hypoxia: Secondary | ICD-10-CM | POA: Diagnosis present

## 2021-12-07 DIAGNOSIS — R531 Weakness: Principal | ICD-10-CM

## 2021-12-07 DIAGNOSIS — Z7982 Long term (current) use of aspirin: Secondary | ICD-10-CM

## 2021-12-07 LAB — COMPREHENSIVE METABOLIC PANEL
ALT: 17 U/L (ref 0–44)
AST: 32 U/L (ref 15–41)
Albumin: 3.2 g/dL — ABNORMAL LOW (ref 3.5–5.0)
Alkaline Phosphatase: 69 U/L (ref 38–126)
Anion gap: 15 (ref 5–15)
BUN: 21 mg/dL (ref 8–23)
CO2: 18 mmol/L — ABNORMAL LOW (ref 22–32)
Calcium: 9.2 mg/dL (ref 8.9–10.3)
Chloride: 108 mmol/L (ref 98–111)
Creatinine, Ser: 1.45 mg/dL — ABNORMAL HIGH (ref 0.44–1.00)
GFR, Estimated: 34 mL/min — ABNORMAL LOW (ref >60)
Glucose, Bld: 201 mg/dL — ABNORMAL HIGH (ref 70–99)
Potassium: 3.5 mmol/L (ref 3.5–5.1)
Sodium: 141 mmol/L (ref 135–145)
Total Bilirubin: 1 mg/dL (ref 0.3–1.2)
Total Protein: 6.5 g/dL (ref 6.5–8.1)

## 2021-12-07 LAB — CBC WITH DIFFERENTIAL/PLATELET
Abs Immature Granulocytes: 0.09 10*3/uL — ABNORMAL HIGH (ref 0.00–0.07)
Basophils Absolute: 0.1 10*3/uL (ref 0.0–0.1)
Basophils Relative: 0 %
Eosinophils Absolute: 0 10*3/uL (ref 0.0–0.5)
Eosinophils Relative: 0 %
HCT: 41.1 % (ref 36.0–46.0)
Hemoglobin: 13.4 g/dL (ref 12.0–15.0)
Immature Granulocytes: 1 %
Lymphocytes Relative: 5 %
Lymphs Abs: 0.7 10*3/uL (ref 0.7–4.0)
MCH: 27.6 pg (ref 26.0–34.0)
MCHC: 32.6 g/dL (ref 30.0–36.0)
MCV: 84.7 fL (ref 80.0–100.0)
Monocytes Absolute: 0.5 10*3/uL (ref 0.1–1.0)
Monocytes Relative: 3 %
Neutro Abs: 14.5 10*3/uL — ABNORMAL HIGH (ref 1.7–7.7)
Neutrophils Relative %: 91 %
Platelets: 195 10*3/uL (ref 150–400)
RBC: 4.85 MIL/uL (ref 3.87–5.11)
RDW: 15.5 % (ref 11.5–15.5)
WBC: 15.9 10*3/uL — ABNORMAL HIGH (ref 4.0–10.5)
nRBC: 0 % (ref 0.0–0.2)

## 2021-12-07 LAB — RESP PANEL BY RT-PCR (FLU A&B, COVID) ARPGX2
Influenza A by PCR: NEGATIVE
Influenza B by PCR: NEGATIVE
SARS Coronavirus 2 by RT PCR: NEGATIVE

## 2021-12-07 LAB — URINALYSIS, ROUTINE W REFLEX MICROSCOPIC
Bilirubin Urine: NEGATIVE
Glucose, UA: NEGATIVE mg/dL
Ketones, ur: 5 mg/dL — AB
Nitrite: NEGATIVE
Protein, ur: 100 mg/dL — AB
Specific Gravity, Urine: 1.018 (ref 1.005–1.030)
pH: 5 (ref 5.0–8.0)

## 2021-12-07 LAB — BRAIN NATRIURETIC PEPTIDE: B Natriuretic Peptide: 1824.1 pg/mL — ABNORMAL HIGH (ref 0.0–100.0)

## 2021-12-07 LAB — LACTIC ACID, PLASMA
Lactic Acid, Venous: 4.5 mmol/L (ref 0.5–1.9)
Lactic Acid, Venous: 3.3 mmol/L (ref 0.5–1.9)

## 2021-12-07 LAB — PROTIME-INR
INR: 1.1 (ref 0.8–1.2)
Prothrombin Time: 14.2 seconds (ref 11.4–15.2)

## 2021-12-07 MED ORDER — METOPROLOL SUCCINATE ER 100 MG PO TB24
100.0000 mg | ORAL_TABLET | Freq: Every day | ORAL | Status: DC
  Administered 2021-12-08 – 2021-12-10 (×3): 100 mg via ORAL
  Filled 2021-12-07 (×2): qty 1
  Filled 2021-12-07: qty 4

## 2021-12-07 MED ORDER — SODIUM CHLORIDE 0.9 % IV SOLN: 500.0000 mg | INTRAVENOUS | Status: DC

## 2021-12-07 MED ORDER — SODIUM CHLORIDE 0.9 % IV SOLN: 1.0000 g | INTRAVENOUS | Status: DC

## 2021-12-07 MED ORDER — SODIUM CHLORIDE 0.9 % IV SOLN
500.0000 mg | INTRAVENOUS | Status: DC
  Administered 2021-12-08 – 2021-12-09 (×2): 500 mg via INTRAVENOUS
  Filled 2021-12-07 (×2): qty 5

## 2021-12-07 MED ORDER — METOPROLOL SUCCINATE ER 25 MG PO TB24
100.0000 mg | ORAL_TABLET | Freq: Every day | ORAL | Status: DC
  Administered 2021-12-07: 100 mg via ORAL
  Filled 2021-12-07: qty 4

## 2021-12-07 MED ORDER — CLONIDINE HCL 0.1 MG PO TABS
0.1000 mg | ORAL_TABLET | Freq: Every day | ORAL | Status: DC
Start: 2021-12-07 — End: 2021-12-07
  Administered 2021-12-07: 0.1 mg via ORAL
  Filled 2021-12-07: qty 1

## 2021-12-07 MED ORDER — SODIUM CHLORIDE 0.9 % IV SOLN: INTRAVENOUS | Status: DC

## 2021-12-07 MED ORDER — AMLODIPINE BESYLATE 10 MG PO TABS
10.0000 mg | ORAL_TABLET | Freq: Every day | ORAL | Status: DC
  Administered 2021-12-08 – 2021-12-10 (×3): 10 mg via ORAL
  Filled 2021-12-07: qty 1
  Filled 2021-12-07: qty 2
  Filled 2021-12-07: qty 1

## 2021-12-07 MED ORDER — CLONIDINE HCL 0.1 MG PO TABS
0.1000 mg | ORAL_TABLET | Freq: Every day | ORAL | Status: DC
  Administered 2021-12-08 – 2021-12-10 (×3): 0.1 mg via ORAL
  Filled 2021-12-07 (×3): qty 1

## 2021-12-07 MED ORDER — SODIUM CHLORIDE 0.9 % IV SOLN
500.0000 mg | Freq: Once | INTRAVENOUS | Status: AC
  Administered 2021-12-07: 500 mg via INTRAVENOUS
  Filled 2021-12-07: qty 5

## 2021-12-07 MED ORDER — SODIUM CHLORIDE 0.9 % IV SOLN
1.0000 g | Freq: Once | INTRAVENOUS | Status: AC
  Administered 2021-12-07: 1 g via INTRAVENOUS
  Filled 2021-12-07: qty 10

## 2021-12-07 MED ORDER — LOSARTAN POTASSIUM 25 MG PO TABS
25.0000 mg | ORAL_TABLET | Freq: Every day | ORAL | Status: DC
  Administered 2021-12-08 – 2021-12-10 (×3): 25 mg via ORAL
  Filled 2021-12-07 (×3): qty 1

## 2021-12-07 MED ORDER — ENOXAPARIN SODIUM 30 MG/0.3ML IJ SOSY
30.0000 mg | PREFILLED_SYRINGE | INTRAMUSCULAR | Status: DC
  Administered 2021-12-07 – 2021-12-09 (×3): 30 mg via SUBCUTANEOUS
  Filled 2021-12-07 (×3): qty 0.3

## 2021-12-07 MED ORDER — LOSARTAN POTASSIUM 50 MG PO TABS
25.0000 mg | ORAL_TABLET | Freq: Every day | ORAL | Status: DC
  Administered 2021-12-07: 25 mg via ORAL
  Filled 2021-12-07: qty 1

## 2021-12-07 MED ORDER — ASPIRIN EC 81 MG PO TBEC
81.0000 mg | DELAYED_RELEASE_TABLET | Freq: Every day | ORAL | Status: DC
  Administered 2021-12-07 – 2021-12-10 (×4): 81 mg via ORAL
  Filled 2021-12-07 (×4): qty 1

## 2021-12-07 MED ORDER — IOHEXOL 350 MG/ML SOLN
80.0000 mL | Freq: Once | INTRAVENOUS | Status: AC | PRN
  Administered 2021-12-07: 80 mL via INTRAVENOUS

## 2021-12-07 MED ORDER — AMLODIPINE BESYLATE 5 MG PO TABS
10.0000 mg | ORAL_TABLET | Freq: Every day | ORAL | Status: DC
Start: 2021-12-07 — End: 2021-12-07
  Administered 2021-12-07: 10 mg via ORAL
  Filled 2021-12-07: qty 2

## 2021-12-07 MED ORDER — SODIUM CHLORIDE 0.9 % IV BOLUS
500.0000 mL | Freq: Once | INTRAVENOUS | Status: AC
  Administered 2021-12-07: 500 mL via INTRAVENOUS

## 2021-12-07 NOTE — ED Notes (Signed)
;  pt have urge nothing is coming out pt and family has label specimen cup ?

## 2021-12-07 NOTE — ED Notes (Signed)
Pt placed on a hospital bed for comfort

## 2021-12-07 NOTE — ED Notes (Signed)
Patient transported to CT 

## 2021-12-07 NOTE — Assessment & Plan Note (Addendum)
Remains suboptimally controlled.  Continue home amlodipine, clonidine, losartan and metoprolol. ?continue PRN hydralazine ?

## 2021-12-07 NOTE — ED Provider Notes (Signed)
?  Physical Exam  ?BP (!) 165/66   Pulse 79   Temp 98.5 ?F (36.9 ?C) (Oral)   Resp (!) 24   Ht 5\' 6"  (1.676 m)   Wt 83.9 kg   SpO2 97%   BMI 29.86 kg/m?  ? ?Physical Exam ? ?Procedures  ?Procedures ? ?ED Course / MDM  ?  ?Medical Decision Making ?Care assumed at 3 PM.  Patient is here with shortness of breath and weakness and fall.  Patient had new oxygen requirement and room air sat was 89%.  Patient was put on 6 L. Patient initially has white blood cell count of 15 and lactate of 4.  Signout pending D-dimer and admission ? ?7:52 PM ?Unfortunately patient's D-dimer clotted.  Patient is a difficult IV stick and since she became hypoxic, I ordered a CTA chest.  Patient also had a fall and has mild epigastric tenderness so I also ordered CT abdomen pelvis.  CT showed possible pneumonia.  Patient given antibiotics Hospitalist to admit for hypoxia from pneumonia ? ?CRITICAL CARE ?Performed by: Wandra Arthurs ? ? ?Total critical care time: 30 minutes ? ?Critical care time was exclusive of separately billable procedures and treating other patients. ? ?Critical care was necessary to treat or prevent imminent or life-threatening deterioration. ? ?Critical care was time spent personally by me on the following activities: development of treatment plan with patient and/or surrogate as well as nursing, discussions with consultants, evaluation of patient's response to treatment, examination of patient, obtaining history from patient or surrogate, ordering and performing treatments and interventions, ordering and review of laboratory studies, ordering and review of radiographic studies, pulse oximetry and re-evaluation of patient's condition. ? ?Angiocath insertion ?Performed by: Wandra Arthurs ? ?Consent: Verbal consent obtained. ?Risks and benefits: risks, benefits and alternatives were discussed ?Time out: Immediately prior to procedure a "time out" was called to verify the correct patient, procedure, equipment, support staff  and site/side marked as required. ? ?Preparation: Patient was prepped and draped in the usual sterile fashion. ? ?Vein Location: R antecube ? ?Ultrasound Guided ? ?Gauge: 20 long  ? ?Normal blood return and flush without difficulty ?Patient tolerance: Patient tolerated the procedure well with no immediate complications. ? ? ? ? ?Problems Addressed: ?Community acquired pneumonia, unspecified laterality: acute illness or injury ?Hypoxia: acute illness or injury ?Lactic acidosis: acute illness or injury ?Weakness: acute illness or injury ? ?Amount and/or Complexity of Data Reviewed ?Labs: ordered. Decision-making details documented in ED Course. ?Radiology: ordered and independent interpretation performed. Decision-making details documented in ED Course. ?ECG/medicine tests: ordered and independent interpretation performed. Decision-making details documented in ED Course. ? ?Risk ?OTC drugs. ?Prescription drug management. ?Decision regarding hospitalization. ? ? ? ? ? ? ? ?  ?Drenda Freeze, MD ?12/07/21 1953 ? ?

## 2021-12-07 NOTE — Assessment & Plan Note (Deleted)
Presented with increasing weakness, shortness of breath and hypoxia requiring 6 L via nasal cannula.  Has lactate of 4.5. ?Continue treatment as above ?Patient given full sepsis bolus as she is hemodynamically stable and is awaiting BNP due to questionable edema seen on CT a chest ?

## 2021-12-07 NOTE — Assessment & Plan Note (Signed)
Continue PPI ?

## 2021-12-07 NOTE — ED Provider Notes (Addendum)
?[facility] ?Provider Note ? ? ?CSN: [id number] ?Arrival date & time: 12/07/21  7939 ? ?  ? ?History ? ?Chief Complaint  ?Patient presents with  ? Fall  ? ? ?Robin Hobbs is a 88 y.o. female. ? ?HPI ?Elderly female with multiple medical issues including hypertension presents with family members due to weakness.  Patient herself has no complaints, seems to acknowledge weakness, generally, not focally.  Family seems to think that the weakness is progressed recently, unclear timeframe, and she has had falls.  Most recent fall was a few days ago, on her backside, no head contact, no syncope, and she was initially back on her feet, interacting in a typical manner, however this has diminished substantially since that time.  No recent change in medication, diet, activity. ?  ? ?Home Medications ?Prior to Admission medications   ?Medication Sig Start Date End Date Taking? Authorizing Provider  ?acetaminophen (TYLENOL) 500 MG tablet Take 1,000 mg by mouth every 6 (six) hours as needed for mild pain.   Yes [provider]  ?amLODipine (NORVASC) 10 MG tablet Take 10 mg by mouth daily. 03/02/14  Yes [provider]  ?aspirin EC 81 MG EC tablet Take 1 tablet (81 mg total) by mouth daily. Swallow whole. 03/04/20  Yes Charolette Forward, MD  ?cloNIDine (CATAPRES) 0.1 MG tablet Take 0.1 mg by mouth daily. 10/31/21  Yes [provider]  ?losartan (COZAAR) 25 MG tablet Take 25 mg by mouth daily. 11/26/21  Yes [provider]  ?metoprolol succinate (TOPROL-XL) 50 MG 24 hr tablet Take 100 mg by mouth daily.  03/11/14  Yes [provider]  ?pantoprazole (PROTONIX) 40 MG tablet Take 1 tablet (40 mg total) by mouth daily at 6 (six) AM. 03/04/20  Yes Charolette Forward, MD  ?   ? ?Allergies    ?Patient has no known allergies.   ? ?Review of Systems   ?Review of Systems  ?Unable to perform ROS: Age  ? ?Physical Exam ?Updated Vital Signs ?BP (!) 163/126   Pulse 95   Temp  98.5 ?F (36.9 ?C) (Oral)   Resp 17   Ht 5\' 6"  (1.676 m)   Wt 83.9 kg   SpO2 96%   BMI 29.86 kg/m?  ?Physical Exam ?Vitals and nursing note reviewed.  ?Constitutional:   ?   General: She is not in acute distress. ?   Appearance: She is well-developed.  ?HENT:  ?   Head: Normocephalic and atraumatic.  ?Eyes:  ?   Conjunctiva/sclera: Conjunctivae normal.  ?Cardiovascular:  ?   Rate and Rhythm: Normal rate and regular rhythm.  ?Pulmonary:  ?   Effort: Pulmonary effort is normal. No respiratory distress.  ?   Breath sounds: Normal breath sounds. No stridor.  ?Abdominal:  ?   General: There is no distension.  ?Musculoskeletal:  ?   Comments: Pelvis stable.  Patient flexes both hips, knees, ankles w/o any pain or limitation.  ?Skin: ?   General: Skin is warm and dry.  ?Neurological:  ?   Mental Status: She is alert and oriented to person, place, and time.  ?   Cranial Nerves: No cranial nerve deficit.  ?Psychiatric:     ?   Mood and Affect: Mood normal.     ?   Cognition and Memory: Memory is impaired.  ? ? ?ED Results / Procedures / Treatments   ?Labs ?(all labs ordered are listed, but only abnormal results are displayed) ?Labs Reviewed  ?  COMPREHENSIVE METABOLIC PANEL - Abnormal; Notable for the following components:  ?    Result Value  ? CO2 18 (*)   ? Glucose, Bld 201 (*)   ? Creatinine, Ser 1.45 (*)   ? Albumin 3.2 (*)   ? GFR, Estimated 34 (*)   ? All other components within normal limits  ?LACTIC ACID, PLASMA - Abnormal; Notable for the following components:  ? Lactic Acid, Venous 4.5 (*)   ? All other components within normal limits  ?CBC WITH DIFFERENTIAL/PLATELET - Abnormal; Notable for the following components:  ? WBC 15.9 (*)   ? Neutro Abs 14.5 (*)   ? Abs Immature Granulocytes 0.09 (*)   ? All other components within normal limits  ?URINALYSIS, ROUTINE W REFLEX MICROSCOPIC - Abnormal; Notable for the following components:  ? APPearance HAZY (*)   ? Hgb urine dipstick MODERATE (*)   ? Ketones, ur 5 (*)   ?  Protein, ur 100 (*)   ? Leukocytes,Ua TRACE (*)   ? Bacteria, UA FEW (*)   ? All other components within normal limits  ?RESP PANEL BY RT-PCR (FLU A&B, COVID) [id number]  ?URINE CULTURE  ?PROTIME-INR  ?LACTIC ACID, PLASMA  ? ? ?EKG ?EKG Interpretation ? ?Date/Time:  Wednesday Dec 07 2021 09:26:38 EDT ?Ventricular Rate:  108 ?PR Interval:  152 ?QRS Duration: 84 ?QT Interval:  352 ?QTC Calculation: 471 ?R Axis:   -15 ?Text Interpretation: Sinus tachycardia with occasional Premature ventricular complexes Septal infarct , age undetermined Abnormal ECG Confirmed by Carmin Muskrat [phone]) on 12/07/2021 9:34:26 AM ? ?Radiology ?CT Head Wo Contrast ? ?Result Date: 12/07/2021 ?CLINICAL DATA:  Head trauma, moderate-severe EXAM: CT HEAD WITHOUT CONTRAST TECHNIQUE: Contiguous axial images were obtained from the base of the skull through the vertex without intravenous contrast. RADIATION DOSE REDUCTION: This exam was performed according to the departmental dose-optimization program which includes automated exposure control, adjustment of the mA and/or kV according to patient size and/or use of iterative reconstruction technique. COMPARISON:  CT head 02/27/2020. FINDINGS: Brain: No evidence of acute infarction, hemorrhage, hydrocephalus, extra-axial collection or mass lesion/mass effect. Patchy white matter hypoattenuation, nonspecific but compatible with chronic microvascular ischemic disease. Cerebral atrophy, mild for age. Partially empty sella. Vascular: Intracranial atherosclerosis. No hyperdense vessel identified. Skull: No acute fracture. Sinuses/Orbits: No acute orbital findings. Clear visualized sinuses. Other: No mastoid effusions. IMPRESSION: No evidence of acute intracranial abnormality. Electronically Signed   By: Margaretha Sheffield M.D.   On: 12/07/2021 10:25  ? ?DG Chest Port 1 View ? ?Result Date: 12/07/2021 ?CLINICAL DATA:  Shortness of breath. EXAM: PORTABLE CHEST 1 VIEW COMPARISON:  February 27, 2020. FINDINGS: The heart  size and mediastinal contours are within normal limits. Minimal bibasilar subsegmental atelectasis is noted. The visualized skeletal structures are unremarkable. IMPRESSION: Minimal bibasilar subsegmental atelectasis. Aortic Atherosclerosis (ICD10-I70.0). Electronically Signed   By: Marijo Conception M.D.   On: 12/07/2021 10:06   ? ?Procedures ?Procedures  ? ? ?Medications Ordered in ED ?Medications  ?amLODipine (NORVASC) tablet 10 mg (has no administration in time range)  ?aspirin EC tablet 81 mg (has no administration in time range)  ?cloNIDine (CATAPRES) tablet 0.1 mg (has no administration in time range)  ?losartan (COZAAR) tablet 25 mg (has no administration in time range)  ?metoprolol succinate (TOPROL-XL) 24 hr tablet 100 mg (has no administration in time range)  ?sodium chloride 0.9 % bolus 500 mL (0 mLs Intravenous Stopped 12/07/21 1300)  ?sodium chloride 0.9 % bolus 500 mL (500 mLs  Intravenous New Bag/Given 12/07/21 1420)  ? ? ?ED Course/ Medical Decision Making/ A&P ?This patient with a Hx of hypertension presents to the ED for concern of weakness, this involves an extensive number of treatment options, and is a complaint that carries with it a high risk of complications and morbidity.   ? ?The differential diagnosis includes hypertensive crisis, infection, dehydration, bacteremia, sepsis ? ? ?Social Determinants of Health: ? ?Age ? ?Additional history obtained: ? ?Additional history and/or information obtained from son, notable for HPI, and chart review with discharge diagnosis from hospitalization with her primary care physician included below: ? ?DATE OF DISCHARGE:  03/03/2020 ?  ?ADMITTING DIAGNOSES: ?1.  Urinary tract infection. ?2.  Hypertension. ?3.  Hyperlipidemia. ?4.  Acute on chronic kidney injury. ?5.  Dehydration. ?6.  Degenerative joint disease. ?7.  Questionable history of septal myocardial infarction -- age undetermined. ?8.  Minimally elevated high sensitivity troponin I secondary to  above/acute kidney injury. ?  ?FINAL DIAGNOSES: ?1.  Status post urinary tract infection, hypertension. ?2.  Hyperlipidemia. ?3.  Status post acute on chronic kidney injury, now on baseline, status post dehydration. ?4.

## 2021-12-07 NOTE — ED Notes (Signed)
Unable to decrease O2 to the pt due to pt's dropping on SPO2 to 89% on O2 lower than 6L, ED provider made aware of this, pt is resting on bed comfortable NAD noticed. ?

## 2021-12-07 NOTE — H&P (Addendum)
?History and Physical  ? ? ?Patient: Robin Hobbs DOB: May 02, 1926 ?DOA: 12/07/2021 ?DOS: the patient was seen and examined on 12/08/2021 ?PCP: Charolette Forward, MD  ?Patient coming from: Home ? ?Chief Complaint:  ?Chief Complaint  ?Patient presents with  ? Fall  ? ?HPI: Robin Hobbs is a 88 y.o. female with medical history significant of hypertension and GERD who presents with concerns of fall and weakness. ? ?Patient is alert and oriented to self and place but not time which is her baseline.  Family at bedside helps with history. ?Patient lives at home with her son and usually stays in her basement alone.  She normally ambulates independently but had a fall in the bathroom several days ago.  Since then has become progressively weaker and today family had to help her out of bed.  She has also been having increasing shortness of breath with exertion.  Denies any cough or fever.  Denies any chest pain.  No lower extremity edema. ? ?In the ED, she was found to be hypertensive with SBP of 179 and hypoxic down to 88% requiring 6 L via nasal cannula.  Has leukocytosis of 15.9 and lactate of 4.5.  Hemoglobin is normal at 13.4.  Creatinine is mildly elevated at 1.45 but appears to have been higher in remote labs. ? ?CT head is negative. ?CTA chest was negative for pulmonary embolism but had borderline to mild cardiomegaly with small bilateral pleural effusion.  Mild diffuse bilateral hazy pulmonary density questionable mild edema.  No acute findings in intra-abdominal or pelvic CT. ? ?EKG shows occasional PVC but no significant ST or T wave changes. ? ? ?Review of Systems: unable to review all systems due to the inability of the patient to answer questions. ?Past Medical History:  ?Diagnosis Date  ? Hypertension   ? ?Past Surgical History:  ?Procedure Laterality Date  ? BREAST CYST EXCISION Right   ? ?Social History:  reports that she has quit smoking. She has never used smokeless tobacco. She reports that she  does not drink alcohol and does not use drugs. ? ?No Known Allergies ? ?History reviewed. No pertinent family history. ? ?Prior to Admission medications   ?Medication Sig Start Date End Date Taking? Authorizing Provider  ?acetaminophen (TYLENOL) 500 MG tablet Take 1,000 mg by mouth every 6 (six) hours as needed for mild pain.   Yes [provider]  ?amLODipine (NORVASC) 10 MG tablet Take 10 mg by mouth daily. 03/02/14  Yes [provider]  ?aspirin EC 81 MG EC tablet Take 1 tablet (81 mg total) by mouth daily. Swallow whole. 03/04/20  Yes Charolette Forward, MD  ?cloNIDine (CATAPRES) 0.1 MG tablet Take 0.1 mg by mouth daily. 10/31/21  Yes [provider]  ?losartan (COZAAR) 25 MG tablet Take 25 mg by mouth daily. 11/26/21  Yes [provider]  ?metoprolol succinate (TOPROL-XL) 50 MG 24 hr tablet Take 100 mg by mouth daily.  03/11/14  Yes [provider]  ?pantoprazole (PROTONIX) 40 MG tablet Take 1 tablet (40 mg total) by mouth daily at 6 (six) AM. 03/04/20  Yes Charolette Forward, MD  ? ? ?Physical Exam: ?Vitals:  ? 12/07/21 2030 12/07/21 2200 12/08/21 0030 12/08/21 0100  ?BP: (!) 173/148 (!) 154/71 (!) 169/69 (!) 141/59  ?Pulse: 87 80 83 79  ?Resp: (!) 23 (!) 23 (!) 21 19  ?Temp:      ?TempSrc:      ?SpO2: 99% 99% 98% 98%  ?Weight:      ?  Height:      ? ?Constitutional: NAD, calm, comfortable, elderly female appearing younger than her age laying flat in bed ?Eyes: lids and conjunctivae normal ?ENMT: Mucous membranes are moist.  ?Neck: normal, supple ?Respiratory: Crackles to the left lower lobe.  Normal respiratory effort on 6 L via nasal cannula. No accessory muscle use.  Able to speak in full sentences. ?Cardiovascular: Regular rate and rhythm, no murmurs / rubs / gallops. No extremity edema. 2+ pedal pulses. ?Abdomen: no tenderness,  Bowel sounds positive.  ?Musculoskeletal: no clubbing / cyanosis. No joint deformity upper and lower extremities. Good ROM, no contractures. Normal  muscle tone.  ?Skin: no rashes, lesions, ulcers. No induration ?Neurologic: CN 2-12 grossly intact. Strength 5/5 in all 4.  Patient able to sit up in bed on her own with minimal assistance. ?Psychiatric: Normal judgment and insight. Alert and oriented to self, place and family at bedside but not to time which family reports is her baseline ?Data Reviewed: ? ?See HPI ? ?Assessment and Plan: ?* Acute respiratory failure with hypoxia (HCC) ?Community acquired pneumonia ?New onset CHF ?Presented with weakness and hypoxia down to 80% requiring up to 6 L via nasal cannula. ?CTA chest was negative for pulmonary embolism and showed mild cardiomegaly with bilateral hazy opacity possibly pulmonary edema.  She appears euvolemic on exam and CBC has leukocytosis with elevated lactic acidosis that is more convincing of infection likely community-acquired pneumonia but later BNP returned greater than 1800 concerning for new CHF exacerbation.  ?-stat troponin in 400s. Could be demand ischemia-will continue to trend. If there is significant delta change with next troponin will need to start heparin infusion. Trial small dose 20mg  dose of Lasix since she also has questionable sepsis.  ?-obtain echocardiogram, strict Is and Os  ?- Continue IV Rocephin and azithromycin for now as procalcitonin returned high. ?-She has received 1 L of normal saline bolus in ED but will hold off on further fluids.  ? ?SIRS (systemic inflammatory response syndrome) (HCC) ?Presented with increasing weakness, shortness of breath and hypoxia requiring 6 L via nasal cannula.  Has lactate of 4.5. ?Has leukocytosis but BMP now returning elevated concerning for new CHF exacerbation. ? ?GERD (gastroesophageal reflux disease) ?Continue PPI ? ?Essential hypertension ?Elevated.  Continue home amlodipine, clonidine, losartan and metoprolol. ? ? ? ? ? Advance Care Planning:   Code Status: DNR -discussed with granddaughter and granddaughter's mother at  bedside ? ?Consults: None ? ?Family Communication: discussed with granddaughter and granddaughter's mother at bedside. ?Later called Son whose number is on record and updated him on new findings of CHF exacerbation and further workup.  ? ?Severity of Illness: ?The appropriate patient status for this patient is OBSERVATION. Observation status is judged to be reasonable and necessary in order to provide the required intensity of service to ensure the patient's safety. The patient's presenting symptoms, physical exam findings, and initial radiographic and laboratory data in the context of their medical condition is felt to place them at decreased risk for further clinical deterioration. Furthermore, it is anticipated that the patient will be medically stable for discharge from the hospital within 2 midnights of admission.  ? ?Author: Orene Desanctis, DO ?12/08/2021 1:49 AM ? ?For on call review [email or web address].  ?

## 2021-12-07 NOTE — Assessment & Plan Note (Addendum)
Community acquired pneumonia ?New onset CHF ?Presented with weakness and hypoxia down to 80% requiring up to 6 L via nasal cannula. ?CTA chest was negative for pulmonary embolism and showed mild cardiomegaly with bilateral hazy opacity possibly pulmonary edema.  ?Trace LE edema noted on exam around the ankles ? BNP returned greater than 1800 concerning for new CHF exacerbation.  ?-stat troponin in 400s. Could be demand ischemia. Trending down. Pt without chest pain ?-echocardiogram performed, findings notable for severe pulm HTN with severe TR ?- Also concern for PNA with elevated procal of 2 ?-Continue IV Rocephin and azithromycin for now as procalcitonin returned high at over 2 ?- given dose of lasix 5/4 ?-O2 has been weaned to [facility] this AM ?-repeat bmet in AM ?

## 2021-12-07 NOTE — ED Provider Triage Note (Signed)
Emergency Medicine Provider Triage Evaluation Note ? ?Robin Hobbs , a 88 y.o. female  was evaluated in triage.  Patient presenting with her son due to the concern of altered mental status.  He says she is normally only oriented to self and place however lately she has been more confused and having delusions and unsure where she is even more.  Says she is also experiencing urinary frequency and last time she had the symptoms she had a and AKI and was admitted to the hospital. ? ?He also states that patient had a fall yesterday.  She did not lose consciousness and is not on blood thinners.  Says that he thinks she has been a little bit more confused since that time as well.  Unsure what caused her fall. ? ?Originally satting in the 70s on room air, this improved to the low 90s on 4 L nasal cannula.  When I turned down the oxygen she again fell to the mid 80s.  Does not wear oxygen at baseline. ? ?Review of Systems  ?Positive: Urinary concerns ?Negative: All questions asked to the patient ? ?Physical Exam  ?BP (!) 148/83   Pulse (!) 104   Temp 98.5 ?F (36.9 ?C) (Oral)   Resp (!) 24   SpO2 93%  ?Gen:   Awake, no distress   ?Resp:  Normal effort  ?MSK:   Moves extremities without difficulty  ?Other:  Patient oriented to self and that she is in a hospital.  Denies any pain. ? ?Medical Decision Making  ?Medically screening exam initiated at 9:29 AM.  Appropriate orders placed.  SHARANYA TEMPLIN was informed that the remainder of the evaluation will be completed by another provider, this initial triage assessment does not replace that evaluation, and the importance of remaining in the ED until their evaluation is complete. ? ?Charge nurse aware that the patient needs the next room ?  ?Rhae Hammock, PA-C ?12/07/21 0932 ? ?

## 2021-12-07 NOTE — ED Triage Notes (Signed)
Pt arrives with son who assists with triage. Son reports that pt fell yesterday, no LOC/head injury. States that since then she has been more altered than baseline. Pt is A+Ox2 (missing time and situation). Son reports pt has hx of frequent UTIs. Pt does not wear oxygen at baseline, had initial O2 sat of 70 on RA. Improved to 92% on 6lpm via [place].  ?

## 2021-12-07 NOTE — ED Notes (Signed)
;  pt first attempt unsuccessful  ?

## 2021-12-07 NOTE — Assessment & Plan Note (Addendum)
Presented with increasing weakness, shortness of breath and hypoxia requiring 6 L via nasal cannula, now down to 2L.  Had lactate of 4.5. ?Has leukocytosis but BMP now returning elevated concerning for new diagnosis of Pulm HTN ?

## 2021-12-08 ENCOUNTER — Observation Stay (HOSPITAL_COMMUNITY): Payer: Medicare HMO

## 2021-12-08 DIAGNOSIS — Z20822 Contact with and (suspected) exposure to covid-19: Secondary | ICD-10-CM | POA: Diagnosis present

## 2021-12-08 DIAGNOSIS — F03A0 Unspecified dementia, mild, without behavioral disturbance, psychotic disturbance, mood disturbance, and anxiety: Secondary | ICD-10-CM | POA: Diagnosis present

## 2021-12-08 DIAGNOSIS — I2510 Atherosclerotic heart disease of native coronary artery without angina pectoris: Secondary | ICD-10-CM | POA: Diagnosis present

## 2021-12-08 DIAGNOSIS — E872 Acidosis, unspecified: Secondary | ICD-10-CM | POA: Diagnosis present

## 2021-12-08 DIAGNOSIS — Z87891 Personal history of nicotine dependence: Secondary | ICD-10-CM | POA: Diagnosis not present

## 2021-12-08 DIAGNOSIS — Z79899 Other long term (current) drug therapy: Secondary | ICD-10-CM | POA: Diagnosis not present

## 2021-12-08 DIAGNOSIS — J9601 Acute respiratory failure with hypoxia: Secondary | ICD-10-CM | POA: Diagnosis present

## 2021-12-08 DIAGNOSIS — I5021 Acute systolic (congestive) heart failure: Secondary | ICD-10-CM | POA: Diagnosis not present

## 2021-12-08 DIAGNOSIS — I272 Pulmonary hypertension, unspecified: Secondary | ICD-10-CM | POA: Diagnosis present

## 2021-12-08 DIAGNOSIS — I248 Other forms of acute ischemic heart disease: Secondary | ICD-10-CM | POA: Diagnosis present

## 2021-12-08 DIAGNOSIS — R0902 Hypoxemia: Secondary | ICD-10-CM | POA: Diagnosis present

## 2021-12-08 DIAGNOSIS — Z66 Do not resuscitate: Secondary | ICD-10-CM | POA: Diagnosis present

## 2021-12-08 DIAGNOSIS — E785 Hyperlipidemia, unspecified: Secondary | ICD-10-CM | POA: Diagnosis present

## 2021-12-08 DIAGNOSIS — I10 Essential (primary) hypertension: Secondary | ICD-10-CM | POA: Diagnosis present

## 2021-12-08 DIAGNOSIS — Z7982 Long term (current) use of aspirin: Secondary | ICD-10-CM | POA: Diagnosis not present

## 2021-12-08 DIAGNOSIS — I493 Ventricular premature depolarization: Secondary | ICD-10-CM | POA: Diagnosis present

## 2021-12-08 DIAGNOSIS — E860 Dehydration: Secondary | ICD-10-CM | POA: Diagnosis present

## 2021-12-08 DIAGNOSIS — J189 Pneumonia, unspecified organism: Secondary | ICD-10-CM | POA: Diagnosis present

## 2021-12-08 DIAGNOSIS — K219 Gastro-esophageal reflux disease without esophagitis: Secondary | ICD-10-CM | POA: Diagnosis present

## 2021-12-08 DIAGNOSIS — M1990 Unspecified osteoarthritis, unspecified site: Secondary | ICD-10-CM | POA: Diagnosis present

## 2021-12-08 DIAGNOSIS — E8809 Other disorders of plasma-protein metabolism, not elsewhere classified: Secondary | ICD-10-CM | POA: Diagnosis present

## 2021-12-08 DIAGNOSIS — I071 Rheumatic tricuspid insufficiency: Secondary | ICD-10-CM | POA: Diagnosis present

## 2021-12-08 DIAGNOSIS — E441 Mild protein-calorie malnutrition: Secondary | ICD-10-CM | POA: Diagnosis present

## 2021-12-08 LAB — LACTIC ACID, PLASMA
Lactic Acid, Venous: 1.2 mmol/L (ref 0.5–1.9)
Lactic Acid, Venous: 1.3 mmol/L (ref 0.5–1.9)

## 2021-12-08 LAB — ECHOCARDIOGRAM COMPLETE
AR max vel: 1.54 cm2
AV Area VTI: 1.5 cm2
AV Area mean vel: 1.45 cm2
AV Mean grad: 6 mmHg
AV Peak grad: 11.3 mmHg
Ao pk vel: 1.68 m/s
Area-P 1/2: 4.24 cm2
Height: 66 in
MV VTI: 1.85 cm2
S' Lateral: 3.2 cm
Weight: 2960 oz

## 2021-12-08 LAB — CBC
HCT: 35.2 % — ABNORMAL LOW (ref 36.0–46.0)
Hemoglobin: 11.6 g/dL — ABNORMAL LOW (ref 12.0–15.0)
MCH: 28 pg (ref 26.0–34.0)
MCHC: 33 g/dL (ref 30.0–36.0)
MCV: 85 fL (ref 80.0–100.0)
Platelets: 158 10*3/uL (ref 150–400)
RBC: 4.14 MIL/uL (ref 3.87–5.11)
RDW: 15.6 % — ABNORMAL HIGH (ref 11.5–15.5)
WBC: 10.6 10*3/uL — ABNORMAL HIGH (ref 4.0–10.5)
nRBC: 0 % (ref 0.0–0.2)

## 2021-12-08 LAB — URINE CULTURE

## 2021-12-08 LAB — TROPONIN I (HIGH SENSITIVITY)
Troponin I (High Sensitivity): 424 ng/L (ref <18)
Troponin I (High Sensitivity): 379 ng/L (ref <18)

## 2021-12-08 LAB — PROCALCITONIN: Procalcitonin: 2.77 ng/mL

## 2021-12-08 MED ORDER — SODIUM CHLORIDE 0.9 % IV SOLN: INTRAVENOUS | Status: DC | PRN

## 2021-12-08 MED ORDER — FUROSEMIDE 10 MG/ML IJ SOLN
20.0000 mg | Freq: Once | INTRAMUSCULAR | Status: AC
  Administered 2021-12-08: 20 mg via INTRAVENOUS
  Filled 2021-12-08: qty 2

## 2021-12-08 MED ORDER — HYDRALAZINE HCL 20 MG/ML IJ SOLN
10.0000 mg | INTRAMUSCULAR | Status: DC | PRN
Start: 2021-12-08 — End: 2021-12-10

## 2021-12-08 MED ORDER — CEFTRIAXONE SODIUM 2 G IJ SOLR
2.0000 g | INTRAMUSCULAR | Status: DC
  Administered 2021-12-08 – 2021-12-09 (×2): 2 g via INTRAVENOUS
  Filled 2021-12-08 (×2): qty 20

## 2021-12-08 MED ORDER — FUROSEMIDE 10 MG/ML IJ SOLN
40.0000 mg | Freq: Once | INTRAMUSCULAR | Status: AC
  Administered 2021-12-08: 40 mg via INTRAVENOUS
  Filled 2021-12-08: qty 4

## 2021-12-08 MED ORDER — PERFLUTREN LIPID MICROSPHERE
1.0000 mL | INTRAVENOUS | Status: AC | PRN
  Administered 2021-12-08: 2 mL via INTRAVENOUS
  Filled 2021-12-08: qty 10

## 2021-12-08 NOTE — Hospital Course (Signed)
88 y.o. female with medical history significant of hypertension and GERD who presents with concerns of fall and weakness.Pt noted to become progressively weaker.  She has also been having increasing shortness of breath with exertion ? ?In the ED, she was found to be hypertensive with SBP of 179 and hypoxic down to 88% requiring 6 L via nasal cannula.  Has leukocytosis of 15.9 and lactate of 4.5.  Hemoglobin is normal at 13.4.  Creatinine is mildly elevated at 1.45  ? ?CTA chest was negative for pulmonary embolism but had borderline to mild cardiomegaly with small bilateral pleural effusion.  Mild diffuse bilateral hazy pulmonary density questionable mild edema.  No acute findings in intra-abdominal or pelvic CT. ?

## 2021-12-08 NOTE — ED Notes (Signed)
each.Date and time results received: 12/08/21 0053 ?(use smartphrase ".now" to insert current time) ? ?Test: trop  ?Critical Value: 426 ? ?Name of Provider Notified: Royal Piedra T. Tu ? ?

## 2021-12-08 NOTE — Progress Notes (Signed)
?  Echocardiogram ?2D Echocardiogram has been performed. ? ?Robin Hobbs ?12/08/2021, 9:18 AM ?

## 2021-12-08 NOTE — Progress Notes (Signed)
?  Progress Note ? ? ?Patient: Robin Hobbs:[id number] DOB: 1925/09/12 DOA: 12/07/2021     0 ?DOS: the patient was seen and examined on 12/08/2021 ?  ?Brief hospital course: ?88 y.o. female with medical history significant of hypertension and GERD who presents with concerns of fall and weakness.Pt noted to become progressively weaker.  She has also been having increasing shortness of breath with exertion ? ?In the ED, she was found to be hypertensive with SBP of 179 and hypoxic down to 88% requiring 6 L via nasal cannula.  Has leukocytosis of 15.9 and lactate of 4.5.  Hemoglobin is normal at 13.4.  Creatinine is mildly elevated at 1.45  ? ?CTA chest was negative for pulmonary embolism but had borderline to mild cardiomegaly with small bilateral pleural effusion.  Mild diffuse bilateral hazy pulmonary density questionable mild edema.  No acute findings in intra-abdominal or pelvic CT. ? ?Assessment and Plan: ?* Acute respiratory failure with hypoxia ([place]) ?Community acquired pneumonia ?New onset CHF ?Presented with weakness and hypoxia down to 80% requiring up to 6 L via nasal cannula. ?CTA chest was negative for pulmonary embolism and showed mild cardiomegaly with bilateral hazy opacity possibly pulmonary edema.  ?Trace LE edema noted on exam around the ankles ? BNP returned greater than 1800 concerning for new CHF exacerbation.  ?-stat troponin in 400s. Could be demand ischemia. Trending down. Pt without chest pain ?-echocardiogram performed, pending results ?- Continue IV Rocephin and azithromycin for now as procalcitonin returned high at over 2 ?- will give additional dose of IV lasix ?-repeat bmet in AM ?-Patient is O2 naive. Currently remains on [facility] ? ?SIRS (systemic inflammatory response syndrome) (HCC) ?Presented with increasing weakness, shortness of breath and hypoxia requiring 6 L via nasal cannula.  Has lactate of 4.5. ?Has leukocytosis but BMP now returning elevated concerning for new CHF  exacerbation. ? ?GERD (gastroesophageal reflux disease) ?Continue PPI ? ?Essential hypertension ?Remains suboptimally controlled.  Continue home amlodipine, clonidine, losartan and metoprolol. ?Will add PRN hydralazine ? ? ? ? ?  ? ?Subjective: Reports breathing better this AM ? ?Physical Exam: ?Vitals:  ? 12/08/21 0700 12/08/21 0730 12/08/21 0800 12/08/21 0830  ?BP: (!) 141/54 (!) 163/60 (!) 164/56 (!) 173/71  ?Pulse: 80 87 79 80  ?Resp: 19 (!) 21 18 20   ?Temp:      ?TempSrc:      ?SpO2: 98% 98% 99% 99%  ?Weight:      ?Height:      ? ?General exam: Awake, laying in bed, in nad ?Respiratory system: Normal respiratory effort, no wheezing ?Cardiovascular system: regular rate, s1, s2 ?Gastrointestinal system: Soft, nondistended, positive BS ?Central nervous system: CN2-12 grossly intact, strength intact ?Extremities: Perfused, no clubbing, trace LE edema around ankles ?Skin: Normal skin turgor, no notable skin lesions seen ?Psychiatry: Mood normal // no visual hallucinations  ? ?Data Reviewed: ? ?Labs reviewed: Cr 1.45, BNP 1824, Trop 424->379  ? ?Family Communication: Pt in room, family not at bedside ? ?Disposition: ?Status is: Observation ?The patient will require care spanning > 2 midnights and should be moved to inpatient because: severity of illness ? Planned Discharge Destination: Home ? ? ? ?Author: ?Marylu Lund, MD ?12/08/2021 9:30 AM ? ?For on call review [email or web address]  ?

## 2021-12-09 DIAGNOSIS — I272 Pulmonary hypertension, unspecified: Secondary | ICD-10-CM

## 2021-12-09 DIAGNOSIS — I10 Essential (primary) hypertension: Secondary | ICD-10-CM | POA: Diagnosis not present

## 2021-12-09 DIAGNOSIS — J9601 Acute respiratory failure with hypoxia: Secondary | ICD-10-CM | POA: Diagnosis not present

## 2021-12-09 LAB — COMPREHENSIVE METABOLIC PANEL
ALT: 15 U/L (ref 0–44)
AST: 21 U/L (ref 15–41)
Albumin: 2.6 g/dL — ABNORMAL LOW (ref 3.5–5.0)
Alkaline Phosphatase: 45 U/L (ref 38–126)
Anion gap: 7 (ref 5–15)
BUN: 31 mg/dL — ABNORMAL HIGH (ref 8–23)
CO2: 28 mmol/L (ref 22–32)
Calcium: 8.7 mg/dL — ABNORMAL LOW (ref 8.9–10.3)
Chloride: 107 mmol/L (ref 98–111)
Creatinine, Ser: 1.5 mg/dL — ABNORMAL HIGH (ref 0.44–1.00)
GFR, Estimated: 32 mL/min — ABNORMAL LOW (ref >60)
Glucose, Bld: 126 mg/dL — ABNORMAL HIGH (ref 70–99)
Potassium: 3.2 mmol/L — ABNORMAL LOW (ref 3.5–5.1)
Sodium: 142 mmol/L (ref 135–145)
Total Bilirubin: 0.4 mg/dL (ref 0.3–1.2)
Total Protein: 5.2 g/dL — ABNORMAL LOW (ref 6.5–8.1)

## 2021-12-09 LAB — CBC
HCT: 30.7 % — ABNORMAL LOW (ref 36.0–46.0)
Hemoglobin: 10.1 g/dL — ABNORMAL LOW (ref 12.0–15.0)
MCH: 28.2 pg (ref 26.0–34.0)
MCHC: 32.9 g/dL (ref 30.0–36.0)
MCV: 85.8 fL (ref 80.0–100.0)
Platelets: UNDETERMINED 10*3/uL (ref 150–400)
RBC: 3.58 MIL/uL — ABNORMAL LOW (ref 3.87–5.11)
RDW: 15.2 % (ref 11.5–15.5)
WBC: 6.3 10*3/uL (ref 4.0–10.5)
nRBC: 0 % (ref 0.0–0.2)

## 2021-12-09 NOTE — Progress Notes (Addendum)
?Progress Note ? ? ?Patient: Robin Hobbs DOB: [birth year] DOA: 12/07/2021     1 ?DOS: the patient was seen and examined on 12/09/2021 ?  ?Brief hospital course: ?88 y.o. female with medical history significant of hypertension and GERD who presents with concerns of fall and weakness.Pt noted to become progressively weaker.  She has also been having increasing shortness of breath with exertion ? ?In the ED, she was found to be hypertensive with SBP of 179 and hypoxic down to 88% requiring 6 L via nasal cannula.  Has leukocytosis of 15.9 and lactate of 4.5.  Hemoglobin is normal at 13.4.  Creatinine is mildly elevated at 1.45  ? ?CTA chest was negative for pulmonary embolism but had borderline to mild cardiomegaly with small bilateral pleural effusion.  Mild diffuse bilateral hazy pulmonary density questionable mild edema.  No acute findings in intra-abdominal or pelvic CT. ? ?Assessment and Plan: ?* Acute respiratory failure with hypoxia ([place]) ?Community acquired pneumonia ?New onset CHF ?Presented with weakness and hypoxia down to 80% requiring up to 6 L via nasal cannula. ?CTA chest was negative for pulmonary embolism and showed mild cardiomegaly with bilateral hazy opacity possibly pulmonary edema.  ?Trace LE edema noted on exam around the ankles ? BNP returned greater than 1800 concerning for new CHF exacerbation.  ?-stat troponin in 400s. Could be demand ischemia. Trending down. Pt without chest pain ?-echocardiogram performed, findings notable for severe pulm HTN with severe TR ?- Also concern for PNA with elevated procal of 2 ?-Continue IV Rocephin and azithromycin for now as procalcitonin returned high at over 2 ?- given dose of lasix 5/4 ?-O2 has been weaned to [facility] this AM ?-repeat bmet in AM ? ?Pulmonary hypertension ([place]) ?-New finding on 2d echo with BNP 1800 with severe pulm HTN and severe TR on echo ?-Improved after brief course of lasix ?-Discussed case with patient's primary Cardiologist  who recommends low dose lasix on d/c ? ?SIRS (systemic inflammatory response syndrome) (HCC) ?Presented with increasing weakness, shortness of breath and hypoxia requiring 6 L via nasal cannula, now down to 2L.  Had lactate of 4.5. ?Has leukocytosis but BMP now returning elevated concerning for new diagnosis of Pulm HTN ? ?GERD (gastroesophageal reflux disease) ?Continue PPI ? ?Essential hypertension ?Remains suboptimally controlled.  Continue home amlodipine, clonidine, losartan and metoprolol. ?continue PRN hydralazine ? ? ? ? ?  ? ?Subjective: States feeling better. Eager to go home soon ? ?Physical Exam: ?Vitals:  ? 12/09/21 0734 12/09/21 0913 12/09/21 1129 12/09/21 1653  ?BP: (!) 175/57  130/68 134/68  ?Pulse: 68  70 72  ?Resp: 18  20 (!) 21  ?Temp: 99.2 ?F (37.3 ?C)  98.2 ?F (36.8 ?C) 99 ?F (37.2 ?C)  ?TempSrc: Oral  Oral Oral  ?SpO2: 100% 96% 96% 95%  ?Weight:      ?Height:      ? ?General exam: Conversant, in no acute distress ?Respiratory system: normal chest rise, clear, no audible wheezing ?Cardiovascular system: regular rhythm, s1-s2 ?Gastrointestinal system: Nondistended, nontender, pos BS ?Central nervous system: No seizures, no tremors ?Extremities: No cyanosis, no joint deformities ?Skin: No rashes, no pallor ?Psychiatry: Affect normal // no auditory hallucinations  ? ? ?Data Reviewed: ? ?Labs reviewed: Cr 1.5, hgb 10.1  ? ?Family Communication: Pt in room, family not at bedside ? ?Disposition: ?Status is: Inpatient ?Continue inpatient stay because: severity of illness ? Planned Discharge Destination: Home ? ? ? ?Author: ?Marylu Lund, MD ?12/09/2021 6:11 PM ? ?For on  call review [email or web address].  ?

## 2021-12-09 NOTE — Progress Notes (Signed)
Attempted to turn patient's oxygen off. Patient's O2 sat dropped to 89. Turned oxygen back to 2L. ?

## 2021-12-09 NOTE — Assessment & Plan Note (Addendum)
-  New finding on 2d echo with BNP 1800 with severe pulm HTN and severe TR on echo ?-Improved after brief course of lasix ?-Discussed case with patient's primary Cardiologist who recommends low dose lasix on d/c ?

## 2021-12-09 NOTE — TOC Progression Note (Signed)
Transition of Care (TOC) - Progression Note  ? ? ?Patient Details  ?Name: Robin Hobbs ?MRN: [id number] ?Date of Birth: 1925-12-21 ? ?Transition of Care (TOC) CM/SW Contact  ?Zenon Mayo, RN ?Phone Number: ?12/09/2021, 6:47 PM ? ?Clinical Narrative:    ?from home with son, acute resp failure with hypoxic ,  SIRS, pulm htn, concern for pna, room air, conts on iv abx.TOC will continue to follow for dc needs. ? ? ?  ?  ? ?Expected Discharge Plan and Services ?  ?  ?  ?  ?  ?                ?  ?  ?  ?  ?  ?  ?  ?  ?  ?  ? ? ?Social Determinants of Health (SDOH) Interventions ?  ? ?Readmission Risk Interventions ?   ? View : No data to display.  ?  ?  ?  ? ? ?

## 2021-12-10 DIAGNOSIS — J9601 Acute respiratory failure with hypoxia: Secondary | ICD-10-CM | POA: Diagnosis not present

## 2021-12-10 LAB — COMPREHENSIVE METABOLIC PANEL
ALT: 16 U/L (ref 0–44)
AST: 18 U/L (ref 15–41)
Albumin: 2.4 g/dL — ABNORMAL LOW (ref 3.5–5.0)
Alkaline Phosphatase: 44 U/L (ref 38–126)
Anion gap: 8 (ref 5–15)
BUN: 35 mg/dL — ABNORMAL HIGH (ref 8–23)
CO2: 27 mmol/L (ref 22–32)
Calcium: 8.8 mg/dL — ABNORMAL LOW (ref 8.9–10.3)
Chloride: 106 mmol/L (ref 98–111)
Creatinine, Ser: 1.48 mg/dL — ABNORMAL HIGH (ref 0.44–1.00)
GFR, Estimated: 33 mL/min — ABNORMAL LOW (ref >60)
Glucose, Bld: 106 mg/dL — ABNORMAL HIGH (ref 70–99)
Potassium: 3.2 mmol/L — ABNORMAL LOW (ref 3.5–5.1)
Sodium: 141 mmol/L (ref 135–145)
Total Bilirubin: 0.6 mg/dL (ref 0.3–1.2)
Total Protein: 5 g/dL — ABNORMAL LOW (ref 6.5–8.1)

## 2021-12-10 LAB — CBC
HCT: 28.2 % — ABNORMAL LOW (ref 36.0–46.0)
Hemoglobin: 9.8 g/dL — ABNORMAL LOW (ref 12.0–15.0)
MCH: 29.4 pg (ref 26.0–34.0)
MCHC: 34.8 g/dL (ref 30.0–36.0)
MCV: 84.7 fL (ref 80.0–100.0)
Platelets: UNDETERMINED 10*3/uL (ref 150–400)
RBC: 3.33 MIL/uL — ABNORMAL LOW (ref 3.87–5.11)
RDW: 15 % (ref 11.5–15.5)
WBC: 5.5 10*3/uL (ref 4.0–10.5)
nRBC: 0 % (ref 0.0–0.2)

## 2021-12-10 MED ORDER — AZITHROMYCIN 500 MG PO TABS: 500.0000 mg | ORAL_TABLET | Freq: Every day | ORAL | 0 refills | Status: AC

## 2021-12-10 MED ORDER — FUROSEMIDE 20 MG PO TABS
20.0000 mg | ORAL_TABLET | Freq: Every day | ORAL | Status: DC
  Administered 2021-12-10: 20 mg via ORAL
  Filled 2021-12-10: qty 1

## 2021-12-10 MED ORDER — CEFDINIR 300 MG PO CAPS: 300.0000 mg | ORAL_CAPSULE | Freq: Two times a day (BID) | ORAL | Status: DC

## 2021-12-10 MED ORDER — AZITHROMYCIN 250 MG PO TABS
500.0000 mg | ORAL_TABLET | Freq: Every day | ORAL | Status: DC
  Administered 2021-12-10: 500 mg via ORAL
  Filled 2021-12-10: qty 2

## 2021-12-10 MED ORDER — POTASSIUM CHLORIDE CRYS ER 20 MEQ PO TBCR
40.0000 meq | EXTENDED_RELEASE_TABLET | Freq: Four times a day (QID) | ORAL | Status: AC
  Administered 2021-12-10 (×2): 40 meq via ORAL
  Filled 2021-12-10 (×2): qty 2

## 2021-12-10 MED ORDER — CEFADROXIL 500 MG PO CAPS
500.0000 mg | ORAL_CAPSULE | Freq: Two times a day (BID) | ORAL | Status: DC
  Administered 2021-12-10: 500 mg via ORAL
  Filled 2021-12-10 (×2): qty 1

## 2021-12-10 MED ORDER — FUROSEMIDE 20 MG PO TABS: 20.0000 mg | ORAL_TABLET | Freq: Every day | ORAL | 1 refills | Status: DC

## 2021-12-10 MED ORDER — POTASSIUM CHLORIDE CRYS ER 20 MEQ PO TBCR: 20.0000 meq | EXTENDED_RELEASE_TABLET | Freq: Every day | ORAL | 0 refills | Status: AC

## 2021-12-10 MED ORDER — CEFADROXIL 500 MG PO CAPS: 500.0000 mg | ORAL_CAPSULE | Freq: Two times a day (BID) | ORAL | 0 refills | Status: AC

## 2021-12-10 NOTE — TOC Transition Note (Addendum)
Transition of Care (TOC) - CM/SW Discharge Note ? ? ?Patient Details  ?Name: Robin Hobbs ?MRN: [id number] ?Date of Birth: 1926/02/05 ? ?Transition of Care (TOC) CM/SW Contact:  ?Carles Collet, RN ?Phone Number: ?12/10/2021, 11:21 AM ? ? ?Clinical Narrative:   Spoke w patient and son in room. Son states she was active w [facility] in 2021, Audrea Muscat indicates it was [place], son would like to try them first if they are unable to take then [place].  ?Patient has RW at home, would like 3/1 and also needs O2. Referral sent to [facility] for 3/1 and O2.  ? ?[facility] able to accept ? ? ? ?Final next level of care: [place] ?Barriers to Discharge: No Barriers Identified ? ? ?Patient Goals and CMS Choice ?Patient states their goals for this hospitalization and ongoing recovery are:: to go home ?CMS Medicare.gov Compare Post Acute Care list provided to:: Other (Comment Required) ?Choice offered to / list presented to : Adult Children ? ?Discharge Placement ?  ?           ?  ?  ?  ?  ? ?Discharge Plan and Services ?  ?  ?           ?DME Arranged: 3-N-1, Oxygen ?DME Agency: [facility] ?Date DME Agency Contacted: 12/10/21 ?Time DME Agency Contacted: [phone] ?Representative spoke with at DME Agency: Delana Meyer ?HH Arranged: PT, OT ?[place] Agency: [facility] ?Date HH Agency Contacted: 12/10/21 ?Time [place]: [phone] ?Representative spoke with at [place]: Danise Mina ? ?Social Determinants of Health (SDOH) Interventions ?  ? ? ?Readmission Risk Interventions ?   ? View : No data to display.  ?  ?  ?  ? ? ? ? ? ?

## 2021-12-10 NOTE — Discharge Summary (Signed)
?[facility] ? ?Physician Discharge Summary  ? ?Patient ID: ?Robin Hobbs ?MRN: [id number] ?DOB/AGE: 07-Apr-1926 88 y.o. ? ?Admit date: 12/07/2021 ?Discharge date:   12/10/2021 ? ? ?PCP: Charolette Forward, MD ? ?DISCHARGE DIAGNOSES:  ?Acute respiratory failure with hypoxia ?Community-acquired pneumonia ?Pulmonary hypertension ?GERD ?Essential hypertension ? ?RECOMMENDATIONS FOR OUTPATIENT FOLLOW UP: ?Patient to follow-up with her cardiologist.  She has an appointment on Tuesday, May 9th as per son. ? ? ? ?Home Health: PT OT ?Equipment/Devices: Home oxygen ? ?CODE STATUS: DNR ? ?DISCHARGE CONDITION: fair ? ?Diet recommendation: As before ? ?INITIAL HISTORY: ?88 y.o. female with medical history significant of hypertension and GERD who presents with concerns of fall and weakness.Pt noted to become progressively weaker.  She has also been having increasing shortness of breath with exertion ?  ?In the ED, she was found to be hypertensive with SBP of 179 and hypoxic down to 88% requiring 6 L via nasal cannula.  Has leukocytosis of 15.9 and lactate of 4.5.  Hemoglobin is normal at 13.4.  Creatinine is mildly elevated at 1.45  ?  ?CTA chest was negative for pulmonary embolism but had borderline to mild cardiomegaly with small bilateral pleural effusion.  Mild diffuse bilateral hazy pulmonary density questionable mild edema.  No acute findings in intra-abdominal or pelvic CT. ? ?Consultations: ?Phone discussion with Dr. Terrence Dupont ? ?Procedures: ?Transthoracic echocardiogram ? ? ?HOSPITAL COURSE:  ? ?Acute respiratory failure with hypoxia/community-acquired pneumonia ?Presented with weakness and hypoxia down to 80% requiring up to 6 L via nasal cannula. CTA chest was negative for pulmonary embolism and showed mild cardiomegaly with bilateral hazy opacity possibly pulmonary edema. Trace LE edema noted on exam around the ankles. BNP returned greater than 1800 concerning for new CHF exacerbation.  ?Echocardiogram showed normal  systolic function but did raise concern for severe pulmonary hypertension ?Previous rounding MD discussed with Dr. Terrence Dupont who recommended discharging the patient on low-dose furosemide. ?Patient was also started on antibiotics due to elevated procalcitonin level.  Will be discharged on oral antibiotics to complete a 5-day course. ?She is now down to 1 to 2 L of oxygen by nasal cannula but did tend to desaturate on room air.  Will be discharged with home oxygen. ?  ?Pulmonary hypertension ([place]) ?-New finding on 2d echo with BNP 1800 with severe pulm HTN and severe TR on echo ?-Improved after brief course of lasix ?-Discussed case with patient's primary Cardiologist who recommends low dose lasix on d/c ?  ?GERD (gastroesophageal reflux disease) ?Continue PPI ?  ?Essential hypertension ?Continue home amlodipine, clonidine, losartan and metoprolol. ? ?Patient is stable.  Seen by PT and OT.  Home health is recommended.  She will also need home oxygen as discussed above.  Okay for discharge today.  Discussed with patient and her son. ? ? ?PERTINENT LABS: ? ?The results of significant diagnostics from this hospitalization (including imaging, microbiology, ancillary and laboratory) are listed below for reference.   ? ?Microbiology: ?Recent Results (from the past 240 hour(s))  ?Resp Panel by RT-PCR (Flu A&B, Covid) Nasopharyngeal Swab     Status: None  ? Collection Time: 12/07/21 11:36 AM  ? Specimen: Nasopharyngeal Swab; Nasopharyngeal(NP) swabs in vial transport medium  ?Result Value Ref Range Status  ? SARS Coronavirus 2 by RT PCR NEGATIVE NEGATIVE Final  ?  Comment: (NOTE) ?SARS-CoV-2 target nucleic acids are NOT DETECTED. ? ?The SARS-CoV-2 RNA is generally detectable in upper respiratory ?specimens during the acute phase of infection. The lowest ?concentration of SARS-CoV-2 viral  copies this assay can detect is ?138 copies/mL. A negative result does not preclude SARS-Cov-2 ?infection and should not be used as the sole  basis for treatment or ?other patient management decisions. A negative result may occur with  ?improper specimen collection/handling, submission of specimen other ?than nasopharyngeal swab, presence of viral mutation(s) within the ?areas targeted by this assay, and inadequate number of viral ?copies(<138 copies/mL). A negative result must be combined with ?clinical observations, patient history, and epidemiological ?information. The expected result is Negative. ? ?Fact Sheet for Patients:  ?EntrepreneurPulse.com.au ? ?Fact Sheet for Healthcare Providers:  ?IncredibleEmployment.be ? ?This test is no t yet approved or cleared by the [place] FDA and  ?has been authorized for detection and/or diagnosis of SARS-CoV-2 by ?FDA under an Emergency Use Authorization (EUA). This EUA will remain  ?in effect (meaning this test can be used) for the duration of the ?COVID-19 declaration under Section 564(b)(1) of the Act, 21 ?U.S.C.section 360bbb-3(b)(1), unless the authorization is terminated  ?or revoked sooner.  ? ? ?  ? Influenza A by PCR NEGATIVE NEGATIVE Final  ? Influenza B by PCR NEGATIVE NEGATIVE Final  ?  Comment: (NOTE) ?The Xpert Xpress SARS-CoV-2/FLU/RSV plus assay is intended as an aid ?in the diagnosis of influenza from Nasopharyngeal swab specimens and ?should not be used as a sole basis for treatment. Nasal washings and ?aspirates are unacceptable for Xpert Xpress SARS-CoV-2/FLU/RSV ?testing. ? ?Fact Sheet for Patients: ?EntrepreneurPulse.com.au ? ?Fact Sheet for Healthcare Providers: ?IncredibleEmployment.be ? ?This test is not yet approved or cleared by the [place] FDA and ?has been authorized for detection and/or diagnosis of SARS-CoV-2 by ?FDA under an Emergency Use Authorization (EUA). This EUA will remain ?in effect (meaning this test can be used) for the duration of the ?COVID-19 declaration under Section 564(b)(1) of the Act,  21 U.S.C. ?section 360bbb-3(b)(1), unless the authorization is terminated or ?revoked. ? ?Performed at [facility], [place] [address], [place], [place] ?[address] ?  ?Urine Culture     Status: Abnormal  ? Collection Time: 12/07/21 12:47 PM  ? Specimen: Urine, Clean Catch  ?Result Value Ref Range Status  ? Specimen Description URINE, CLEAN CATCH  Final  ? Special Requests   Final  ?  NONE ?Performed at [facility], [place] [address] [place], [place] [address] ?  ? Culture MULTIPLE SPECIES PRESENT, SUGGEST RECOLLECTION (A)  Final  ? Report Status 12/08/2021 FINAL  Final  ?Blood culture (routine x 2)     Status: None (Preliminary result)  ? Collection Time: 12/07/21  4:12 PM  ? Specimen: BLOOD  ?Result Value Ref Range Status  ? Specimen Description BLOOD RIGHT ANTECUBITAL  Final  ? Special Requests   Final  ?  BOTTLES DRAWN AEROBIC AND ANAEROBIC Blood Culture adequate volume  ? Culture   Final  ?  NO GROWTH 3 DAYS ?Performed at [facility], [place] [address], [place], [place] [address] ?  ? Report Status PENDING  Incomplete  ?  ? ?Labs: ? ?COVID-19 Labs ? ? ?Lab Results  ?Component Value Date  ? [place] NEGATIVE 12/07/2021  ? [place] NEGATIVE 02/27/2020  ? ? ? ? ?Basic Metabolic Panel: ?Recent Labs  ?Lab 12/07/21 ?3016 12/09/21 ?1006 12/10/21 ?0148  ?NA 141 142 141  ?K 3.5 3.2* 3.2*  ?CL 108 107 106  ?CO2 18* 28 27  ?GLUCOSE 201* 126* 106*  ?BUN 21 31* 35*  ?CREATININE 1.45* 1.50* 1.48*  ?CALCIUM 9.2 8.7* 8.8*  ? ?  Liver Function Tests: ?Recent Labs  ?Lab 12/07/21 ?3014 12/09/21 ?1006 12/10/21 ?0148  ?AST 32 21 18  ?ALT 17 15 16   ?ALKPHOS 69 45 44  ?BILITOT 1.0 0.4 0.6  ?PROT 6.5 5.2* 5.0*  ?ALBUMIN 3.2* 2.6* 2.4*  ? ? ?CBC: ?Recent Labs  ?Lab 12/07/21 ?8403 12/07/21 ?2326 12/09/21 ?1006 12/10/21 ?0148  ?WBC 15.9* 10.6* 6.3 5.5  ?NEUTROABS 14.5*  --   --   --   ?HGB 13.4 11.6* 10.1* 9.8*  ?HCT 41.1 35.2* 30.7* 28.2*  ?MCV 84.7 85.0 85.8 84.7  ?PLT 195 158 PLATELET CLUMPS NOTED ON SMEAR, UNABLE TO  ESTIMATE PLATELET CLUMPS NOTED ON SMEAR, UNABLE TO ESTIMATE  ? ? ?BNP: ?BNP (last 3 results) ?Recent Labs  ?  12/07/21 ?0931  ?BNP 1,824.1*  ? ? ? ?IMAGING STUDIES ?CT Head Wo Contrast ? ?Result Date: 5/

## 2021-12-10 NOTE — Progress Notes (Signed)
Occupational Therapy Treatment ?Patient Details ?Name: Robin Hobbs ?MRN: [id number] ?DOB: 1926-03-23 ?Today's Date: 12/10/2021 ? ? ?History of present illness 88 yo female presenting to ED on 5/3 with fall and AMS. CT showed possible pneumonia. PMH including HTN and GERD. ?  ?OT comments ? PTA, pt was living with her son and was performing BADLs and light IADLs; no use of DME. Pt currently requiring Min Guard A for ADLs and functional mobility; alternating between use of RW with Min guard A and single hand held A. Pt presenting with decreased balance, strength, cognition, and activity tolerance compared to her baseline. SpO2 dropping to 80s on RA and requiring at least 1L O2 for activity. Pt very motivated and son supportive throughout. Pt would benefit from further acute OT to facilitate safe dc. Recommend dc to home with HHOT for further OT to optimize safety, independence with ADLs, and return to PLOF.   ? ?Recommendations for follow up therapy are one component of a multi-disciplinary discharge planning process, led by the attending physician.  Recommendations may be updated based on patient status, additional functional criteria and insurance authorization. ?   ?Follow Up Recommendations ? Home health OT  ?  ?Assistance Recommended at Discharge Intermittent Supervision/Assistance  ?Patient can return home with the following ? A little help with walking and/or transfers;A little help with bathing/dressing/bathroom ?  ?Equipment Recommendations ? Other (comment) (Discussed tub seat and family will purchase once at home)  ?  ?Recommendations for Other Services PT consult ? ?  ?Precautions / Restrictions Precautions ?Precautions: Fall;Other (comment) ?Precaution Comments: watch SpO2 ?Restrictions ?Weight Bearing Restrictions: No  ? ? ?  ? ?Mobility Bed Mobility ?Overal bed mobility: Modified Independent ?  ?  ?  ?  ?  ?  ?General bed mobility comments: Increased time and effort ?  ? ?Transfers ?Overall transfer  level: Needs assistance ?Equipment used: None ?Transfers: Sit to/from Stand ?Sit to Stand: Min guard ?  ?  ?  ?  ?  ?General transfer comment: Min Guard A for safety. Increased effort and time ?  ?  ?Balance Overall balance assessment: Needs assistance ?Sitting-balance support: No upper extremity supported, Feet supported ?Sitting balance-Leahy Scale: Good ?  ?  ?  ?Standing balance-Leahy Scale: Fair ?Standing balance comment: Able to maintain static standing with out UE support. However, reaching out for support iwht dynamic balance ?  ?  ?  ?  ?  ?  ?  ?  ?  ?  ?  ?   ? ?ADL either performed or assessed with clinical judgement  ? ?ADL Overall ADL's : Needs assistance/impaired ?Eating/Feeding: Set up;Sitting ?  ?Grooming: Dance movement psychotherapist;Supervision/safety;Standing ?  ?Upper Body Bathing: Supervision/ safety;Set up;Sitting ?  ?Lower Body Bathing: Min guard;Sit to/from stand ?  ?Upper Body Dressing : Supervision/safety;Set up;Sitting ?  ?Lower Body Dressing: Min guard;Sit to/from stand ?  ?Toilet Transfer: Min guard;Ambulation;Regular Toilet;Grab bars ?  ?Toileting- Water quality scientist and Hygiene: Min guard;Sit to/from stand ?  ?  ?Tub/Shower Transfer Details (indicate cue type and reason): Discussed use of shower seat. ?Functional mobility during ADLs: Min guard;Rolling walker (2 wheels) (RW vs single hand held A) ?General ADL Comments: Pt presenitng with decreased balance and strength compared to baseline. ?  ? ?Extremity/Trunk Assessment Upper Extremity Assessment ?Upper Extremity Assessment: Generalized weakness ?  ?Lower Extremity Assessment ?Lower Extremity Assessment: Defer to PT evaluation ?  ?Cervical / Trunk Assessment ?Cervical / Trunk Assessment: Kyphotic ?  ? ?Vision   ?  ?  ?  Perception   ?  ?Praxis   ?  ? ?Cognition Arousal/Alertness: Awake/alert ?Behavior During Therapy: [facility] for tasks assessed/performed ?Overall Cognitive Status: Impaired/Different from baseline ?Area of Impairment: Problem solving ?   ?  ?  ?  ?  ?  ?  ?  ?  ?  ?  ?  ?  ?  ?Problem Solving: Requires verbal cues ?General Comments: Pt presenting near baseline but requiring Min cues for problem solving. Such as pt forgetting she was holding wash clothe in L hand and then attempted to wipe herself for per icare with R hand. Not recognizing error and requiring cues for realizing error - then she was able ot correct. ?  ?  ?   ?Exercises   ? ?  ?Shoulder Instructions   ? ? ?  ?General Comments SpO2 88-86% on RA upon arrival; placed on 1L and cued for purse lip breathing. SpO2 elevated 93%. Pt requiring 1L for activity and mobility to maintain SpO2 >90%  ? ? ?Pertinent Vitals/ Pain       Pain Assessment ?Pain Assessment: Faces ?Faces Pain Scale: No hurt ?Pain Intervention(s): Monitored during session ? ?Home Living Family/patient expects to be discharged to:: Private residence ?Living Arrangements: Children (Son) ?Available Help at Discharge: Family;Available 24 hours/day ?Type of Home: House ?Home Access: Stairs to enter ?  ?  ?Home Layout: Multi-level ?Alternate Level Stairs-Number of Steps: 6 stairs ?  ?Bathroom Shower/Tub: Tub/shower unit ?  ?Bathroom Toilet: Standard ?  ?  ?Home Equipment: Conservation officer, nature (2 wheels) ?  ?  ?  ? ?  ?Prior Functioning/Environment    ?  ?  ?  ?   ? ?Frequency ? Min 2X/week  ? ? ? ? ?  ?Progress Toward Goals ? ?OT Goals(current goals can now be found in the care plan section) ?   ? ?Acute Rehab OT Goals ?Patient Stated Goal: Get stronger ?OT Goal Formulation: With patient/family ?Time For Goal Achievement: 12/24/21 ?Potential to Achieve Goals: Good  ?Plan     ? ?Co-evaluation ? ? ?   ?  ?  ?  ?  ? ?  ?AM-PAC OT "6 Clicks" Daily Activity     ?Outcome Measure ? ? Help from another person eating meals?: None ?Help from another person taking care of personal grooming?: A Little ?Help from another person toileting, which includes using toliet, bedpan, or urinal?: A Little ?Help from another person bathing (including washing,  rinsing, drying)?: A Little ?Help from another person to put on and taking off regular upper body clothing?: A Little ?Help from another person to put on and taking off regular lower body clothing?: A Little ?6 Click Score: 19 ? ?  ?End of Session Equipment Utilized During Treatment: Oxygen;Rolling walker (2 wheels) ? ?OT Visit Diagnosis: Unsteadiness on feet (R26.81);Other abnormalities of gait and mobility (R26.89);Muscle weakness (generalized) (M62.81) ?  ?Activity Tolerance Patient tolerated treatment well ?  ?Patient Left in chair;with call bell/phone within reach;with family/visitor present ?  ?Nurse Communication Mobility status;Other (comment) (SpO2) ?  ? ?   ? ?Time: 0923-3007 ?OT Time Calculation (min): 41 min ? ?Charges: OT General Charges ?$OT Visit: 1 Visit ?OT Evaluation ?$OT Eval Moderate Complexity: 1 Mod ?OT Treatments ?$Self Care/Home Management : 23-37 mins ? ?Greidys Deland MSOT, OTR/L ?Acute Rehab ?Pager: [phone] ?Office: [phone] ? ?Janiel Derhammer M Abdo Denault ?12/10/2021, 10:43 AM ? ? ?

## 2021-12-10 NOTE — Progress Notes (Signed)
SATURATION QUALIFICATIONS: (This note is used to comply with regulatory documentation for home oxygen) ? ?Patient Saturations on Room Air at Rest = 88% ? ?Patient Saturations on Room Air while Ambulating = 86% ? ?Patient Saturations on 1 Liters of oxygen while Ambulating = 94% ? ?Please briefly explain why patient needs home oxygen: Pt requiring at least 1L of supplemental oxygen to perform activity.  ? ?Robin Hobbs MSOT, OTR/L ?Acute Rehab ?Pager: [phone] ?Office: [phone] ?

## 2021-12-10 NOTE — Progress Notes (Signed)
Pt's oxygen and 3 in 1 arrived.  ? ?Patient discharged: Home with family.  ?Lefaving via: Wheelchair  ?Discharge paperwork reviewed and given: to patient's son. Teach back completed. ?IV and telemetry disconnected. ?Belongings given to patient. ? ?

## 2021-12-10 NOTE — Evaluation (Signed)
Physical Therapy Evaluation ?Patient Details ?Name: Robin Hobbs ?MRN: [id number] ?DOB: 01/30/1926 ?Today's Date: 12/10/2021 ? ?History of Present Illness ? 88 yo female presenting to ED on 5/3 with fall and AMS. CT showed possible pneumonia, has new O2 use and noted SIRS with CHF. PMH including HTN and GERD.  ?Clinical Impression ? Pt was seen for mobility after having been up and doing OT this AM.  Was fairly fatigued, noted her quick decline to sit and rest between small bouts of movement.  Distance to walk definitely impacted by the fatigue, but sats were no lower than 88% on room air as a result of shorter trip.  Returned to cannula and pt did verbalize feeling more comfortable.  Follow along with her to work on strengthening and endurance, and will ask for additional help from family to cover her low endurance initially coming home.  Family is committed to having HHPT see her vs going to stay in a rehab setting with the compromises of familiarity and staffing issues.  Follow for acute PT goals, and will work toward greater standing endurance, improved LE strength and increased quality and efficiency of gait to hopefully get her O2 needs back to baseline.   ?   ? ?Recommendations for follow up therapy are one component of a multi-disciplinary discharge planning process, led by the attending physician.  Recommendations may be updated based on patient status, additional functional criteria and insurance authorization. ? ?Follow Up Recommendations Home health PT ? ?  ?Assistance Recommended at Discharge Intermittent Supervision/Assistance  ?Patient can return home with the following ? A little help with walking and/or transfers;A little help with bathing/dressing/bathroom;Assistance with cooking/housework;Direct supervision/assist for medications management;Direct supervision/assist for financial management;Assist for transportation;Help with stairs or ramp for entrance ? ?  ?Equipment Recommendations None  recommended by PT  ?Recommendations for Other Services ?    ?  ?Functional Status Assessment Patient has had a recent decline in their functional status and demonstrates the ability to make significant improvements in function in a reasonable and predictable amount of time.  ? ?  ?Precautions / Restrictions Precautions ?Precautions: Fall ?Precaution Comments: watch O2 sats ?Restrictions ?Weight Bearing Restrictions: No  ? ?  ? ?Mobility ? Bed Mobility ?Overal bed mobility: Needs Assistance ?Bed Mobility: Supine to Sit, Sit to Supine ?  ?  ?Supine to sit: Min guard ?Sit to supine: Min guard ?  ?General bed mobility comments: lengthy process but is stalling during the task ?  ? ?Transfers ?  ?  ?  ?  ?  ?  ?  ?  ?  ?  ?  ? ?Ambulation/Gait ?Ambulation/Gait assistance: Min guard ?Gait Distance (Feet): 7 Feet ?Assistive device: 1 person hand held assist ?Gait Pattern/deviations: Step-to pattern, Decreased stride length, Wide base of support ?Gait velocity: reduced ?Gait velocity interpretation: <1.31 ft/sec, indicative of household ambulator ?  ?General Gait Details: pt gave out quickly with the task due to fatigue and was reporting even being a bit nauseated ? ?Stairs ?  ?  ?  ?  ?  ? ?Wheelchair Mobility ?  ? ?Modified Rankin (Stroke Patients Only) ?  ? ?  ? ?Balance Overall balance assessment: Needs assistance ?Sitting-balance support: Feet supported ?Sitting balance-Leahy Scale: Good ?  ?  ?Standing balance support: Bilateral upper extremity supported, During functional activity ?Standing balance-Leahy Scale: Fair ?  ?  ?  ?  ?  ?  ?  ?  ?  ?  ?  ?  ?   ? ? ? ?  Pertinent Vitals/Pain Pain Assessment ?Pain Assessment: Faces ?Faces Pain Scale: No hurt  ? ? ?Home Living Family/patient expects to be discharged to:: Private residence ?Living Arrangements: Children ?Available Help at Discharge: Family;Available 24 hours/day ?Type of Home: House ?Home Access: Stairs to enter ?  ?  ?Alternate Level Stairs-Number of Steps: 6  stairs ?Home Layout: Multi-level ?Home Equipment: Conservation officer, nature (2 wheels) ?   ?  ?Prior Function Prior Level of Function : Independent/Modified Independent ?  ?  ?  ?  ?  ?  ?Mobility Comments: no AD used, climbed steps in all portions of house ?ADLs Comments: Pt performs BADLs and washes the dishes. Generally sleeps late and then takes naps during the day. stays awake at night to watch her favorite shows. ?  ? ? ?Hand Dominance  ? Dominant Hand: Left ? ?  ?Extremity/Trunk Assessment  ? Upper Extremity Assessment ?Upper Extremity Assessment: Defer to OT evaluation ?  ? ?Lower Extremity Assessment ?Lower Extremity Assessment: Generalized weakness ?  ? ?Cervical / Trunk Assessment ?Cervical / Trunk Assessment: Kyphotic  ?Communication  ? Communication: No difficulties  ?Cognition Arousal/Alertness: Awake/alert ?Behavior During Therapy: Flat affect ?Overall Cognitive Status: Impaired/Different from baseline ?Area of Impairment: Following commands, Problem solving, Attention ?  ?  ?  ?  ?  ?  ?  ?  ?  ?Current Attention Level: Selective ?  ?Following Commands: Follows one step commands with increased time ?  ?  ?Problem Solving: Requires verbal cues ?General Comments: pt requires help to maneuver to side of bed or will stall during the task.  likewise requires cues to step on side of bed. Very tired after earlier OT session ?  ?  ? ?  ?General Comments General comments (skin integrity, edema, etc.): sats were down to 88% with room air so returned to cannula ? ?  ?Exercises    ? ?Assessment/Plan  ?  ?PT Assessment Patient needs continued PT services  ?PT Problem List Decreased strength;Decreased mobility;Cardiopulmonary status limiting activity ? ?   ?  ?PT Treatment Interventions DME instruction;Gait training;Stair training;Functional mobility training;Therapeutic activities;Therapeutic exercise;Balance training;Neuromuscular re-education;Patient/family education   ? ?PT Goals (Current goals can be found in the Care  Plan section)  ?Acute Rehab PT Goals ?Patient Stated Goal: to go home with family ?PT Goal Formulation: With patient/family ?Time For Goal Achievement: 12/17/21 ?Potential to Achieve Goals: Good ? ?  ?Frequency Min 3X/week ?  ? ? ?Co-evaluation   ?  ?  ?  ?  ? ? ?  ?AM-PAC PT "6 Clicks" Mobility  ?Outcome Measure Help needed turning from your back to your side while in a flat bed without using bedrails?: None ?Help needed moving from lying on your back to sitting on the side of a flat bed without using bedrails?: A Little ?Help needed moving to and from a bed to a chair (including a wheelchair)?: A Little ?Help needed standing up from a chair using your arms (e.g., wheelchair or bedside chair)?: A Little ?Help needed to walk in hospital room?: A Little ?Help needed climbing 3-5 steps with a railing? : A Little ?6 Click Score: 19 ? ?  ?End of Session Equipment Utilized During Treatment: Gait belt;Oxygen ?Activity Tolerance: Treatment limited secondary to medical complications (Comment) ?Patient left: in bed;with call bell/phone within reach;with bed alarm set;with family/visitor present ?Nurse Communication: Mobility status ?PT Visit Diagnosis: Unsteadiness on feet (R26.81);Muscle weakness (generalized) (M62.81);Difficulty in walking, not elsewhere classified (R26.2) ?  ? ?Time: 6294-7654 ?PT Time Calculation (min) (  ACUTE ONLY): 34 min ? ? ?Charges:   PT Evaluation ?$PT Eval Moderate Complexity: 1 Mod ?PT Treatments ?$Therapeutic Activity: 8-22 mins ?  ?   ? ?Ramond Dial ?12/10/2021, 12:22 PM ? ?Mee Hives, PT PhD ?Acute Rehab Dept. Number: [facility] [phone] and [place] [phone] ? ? ?

## 2021-12-12 LAB — CULTURE, BLOOD (ROUTINE X 2)
Culture: NO GROWTH
Special Requests: ADEQUATE

## 2022-08-08 ENCOUNTER — Ambulatory Visit: Payer: Medicare HMO | Admitting: Podiatry

## 2022-08-08 ENCOUNTER — Encounter: Payer: Self-pay | Admitting: Podiatry

## 2022-08-08 DIAGNOSIS — M79675 Pain in left toe(s): Secondary | ICD-10-CM

## 2022-08-08 DIAGNOSIS — M79674 Pain in right toe(s): Secondary | ICD-10-CM

## 2022-08-08 DIAGNOSIS — B351 Tinea unguium: Secondary | ICD-10-CM | POA: Diagnosis not present

## 2022-08-09 NOTE — Progress Notes (Signed)
  Subjective:  Patient ID: Robin Hobbs, female    DOB: 1925-09-07,  MRN: [id number]  Chief Complaint  Patient presents with   Nail Problem    NP  painful right great toenail    89 y.o. female presents with the above complaint. History confirmed with patient.  All nails are thickened elongated and causing pain and discomfort they are unable to cut them the right great toe is the worst.  Objective:  Physical Exam: warm, good capillary refill, no trophic changes or ulcerative lesions, normal DP and PT pulses, and normal sensory exam. Left Foot: dystrophic yellowed discolored nail plates with subungual debris Right Foot: dystrophic yellowed discolored nail plates with subungual debris  Assessment:   1. Pain due to onychomycosis of toenails of both feet      Plan:  Patient was evaluated and treated and all questions answered.  Discussed the etiology and treatment options for the condition in detail with the patient. Educated patient on the topical and oral treatment options for mycotic nails. Recommended debridement of the nails today. Sharp and mechanical debridement performed of all painful and mycotic nails today. Nails debrided in length and thickness using a nail nipper to level of comfort. Discussed treatment options including appropriate shoe gear. Follow up as needed for painful nails.   Return in about 3 months (around 11/07/2022) for painful thick fungal nails.

## 2022-08-25 IMAGING — DX DG CHEST 1V PORT
1 series · 1 of 1 positions shown · non-contrast
Comparison: February 27, 2020.

CLINICAL DATA: Shortness of breath.

EXAM:
PORTABLE CHEST 1 VIEW

[chest]
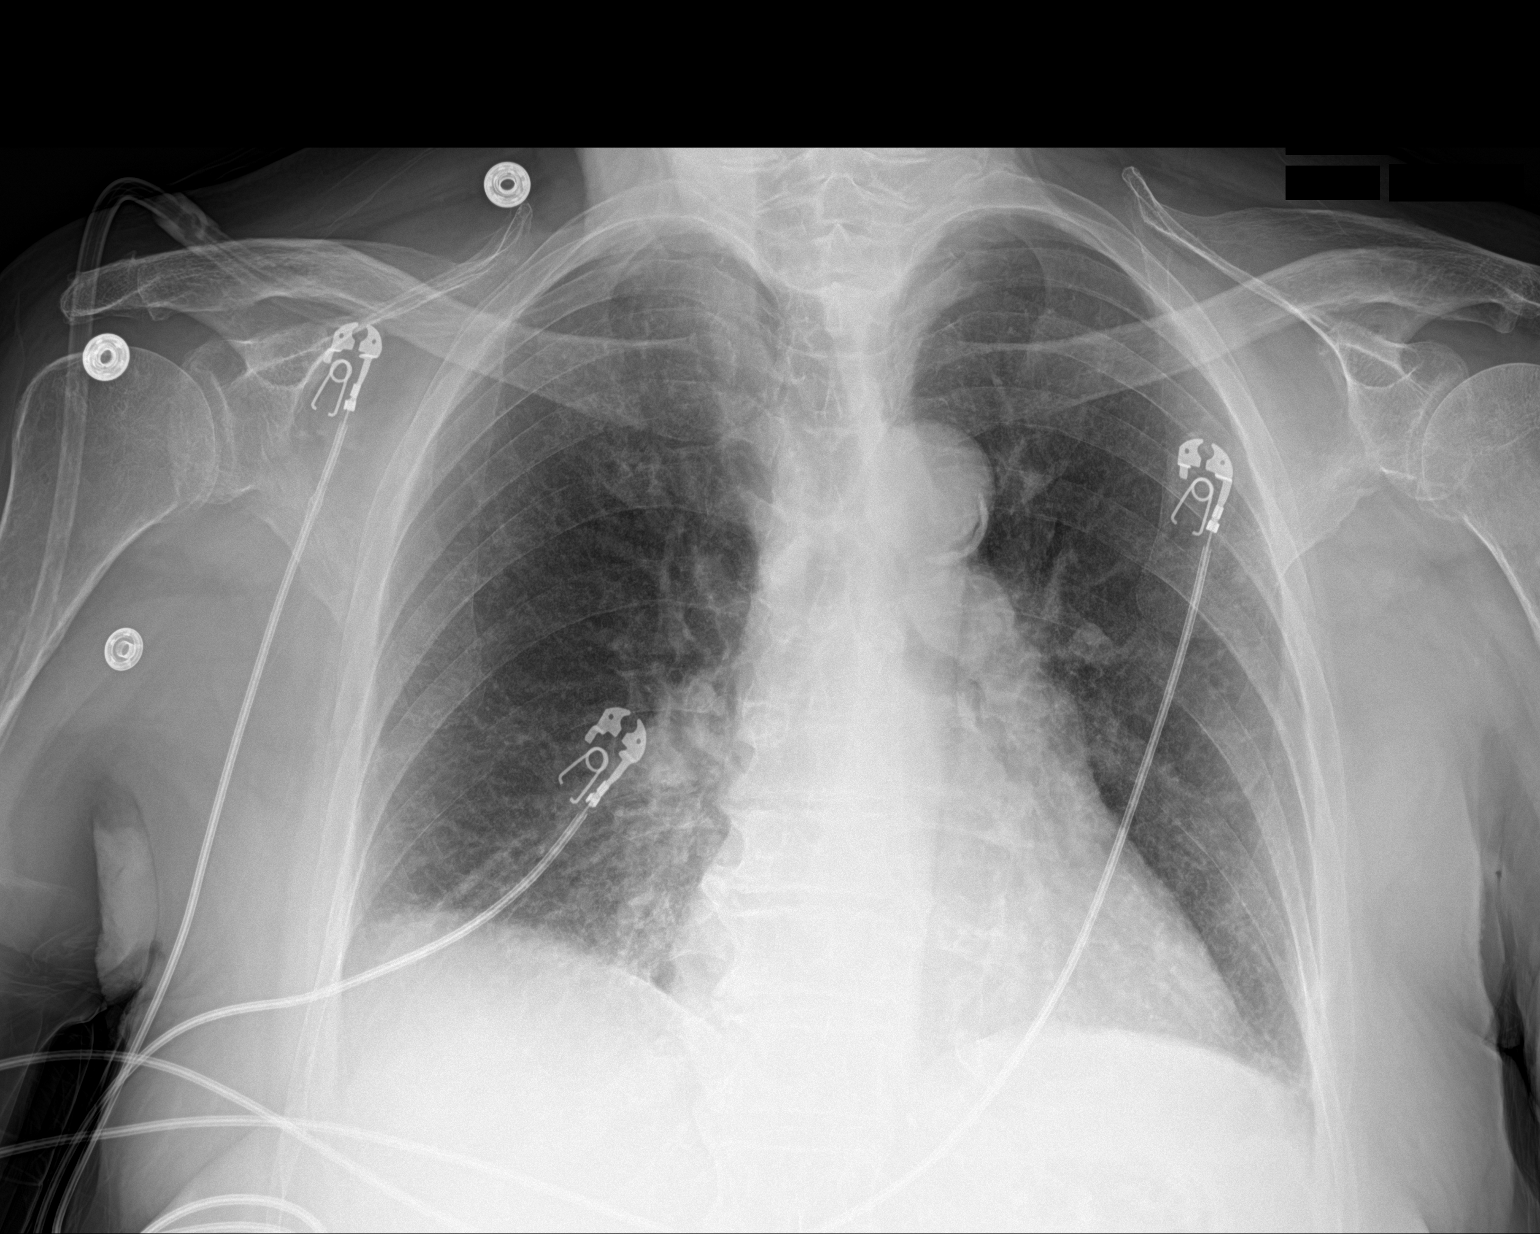

[1 of 1 positions shown; findings below may reference images not displayed]

FINDINGS: The heart size and mediastinal contours are within normal limits.
Minimal bibasilar subsegmental atelectasis is noted. The visualized
skeletal structures are unremarkable.
IMPRESSION: Minimal bibasilar subsegmental atelectasis.

Aortic Atherosclerosis ([id number]).

## 2022-08-25 IMAGING — CT CT ABD-PELV W/ CM
3 of 13 series · 12 of 46 positions shown, 17 images · IV contrast (agent unspecified)
Comparison: Chest x-ray 12/07/2021

CLINICAL DATA: Abdomen pain history of fall altered



[Series 5: a/p w/ 5mm · axial · 0.91mm/px · z∈[+720,+1180]mm · 3 of 93 slices shown, 7 images]
[im 1/93  soft-tissue]
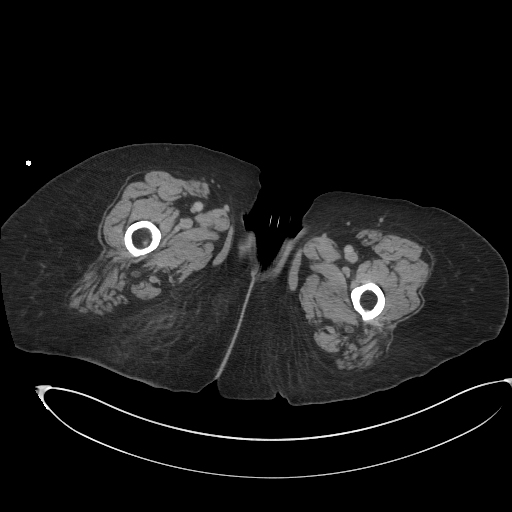
[im 1/93  lung]
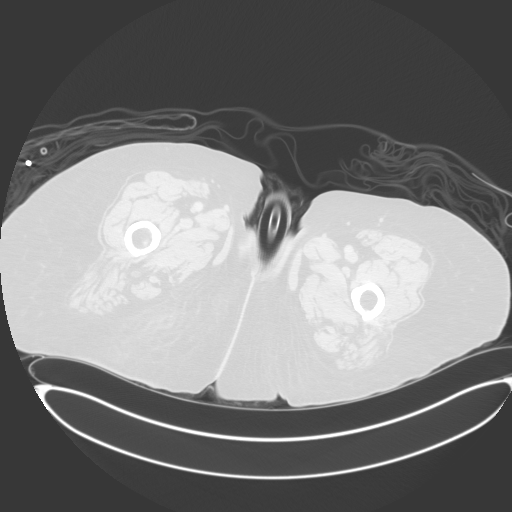
[im 1/93  bone]
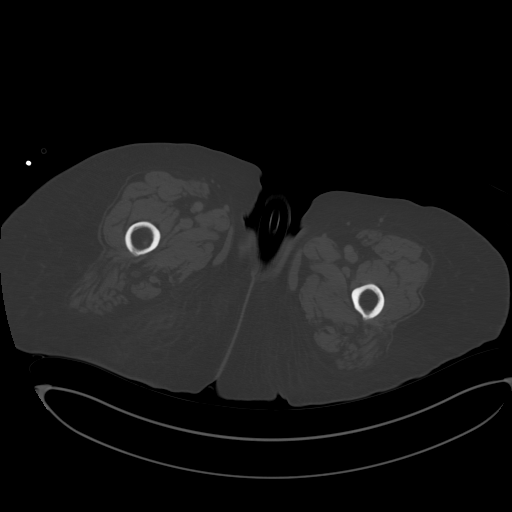
[im 47/93  soft-tissue]
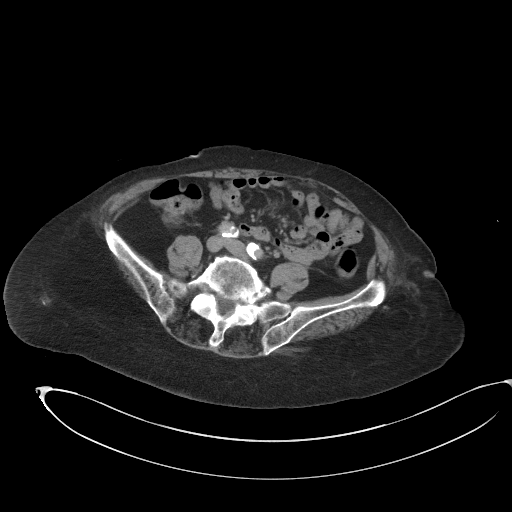
[im 47/93  lung]
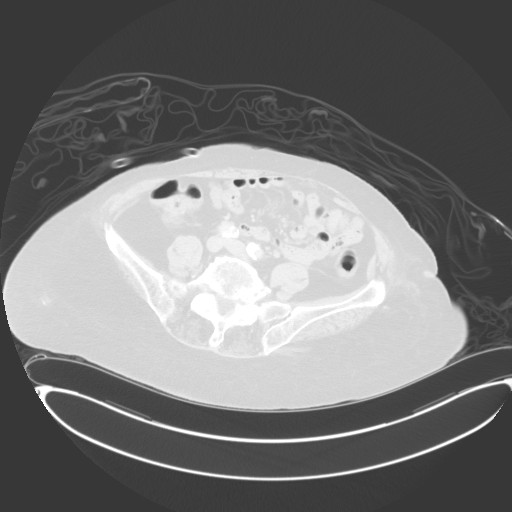
[im 93/93  soft-tissue]
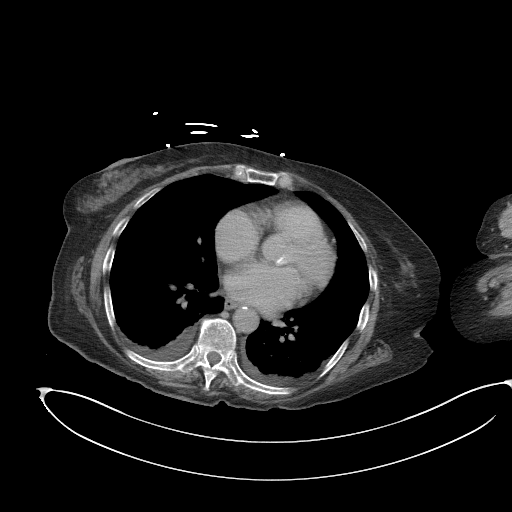
[im 93/93  lung]
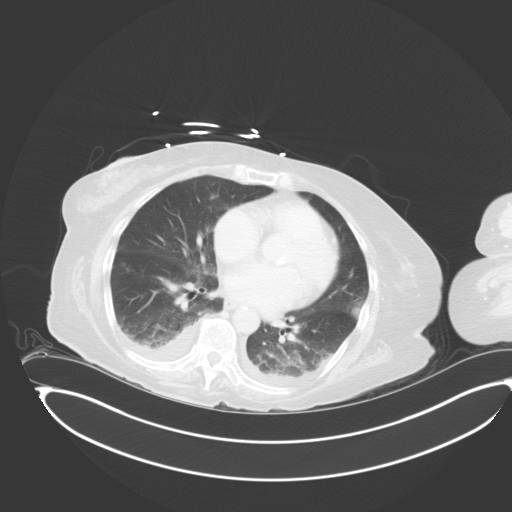

[Series 8: pe thins · axial · 0.77mm/px · z∈[+1074,+1294]mm · 7 of 474 slices shown]
[im 53/474  soft-tissue]
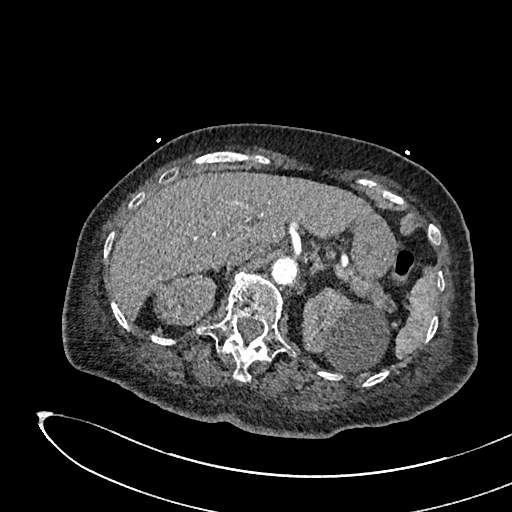
[im 106/474  soft-tissue]
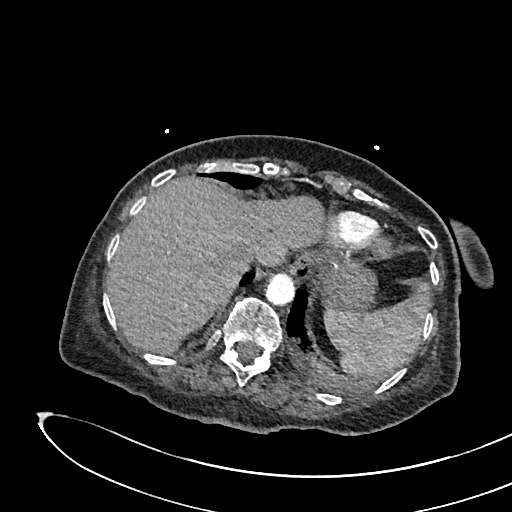
[im 158/474  soft-tissue]
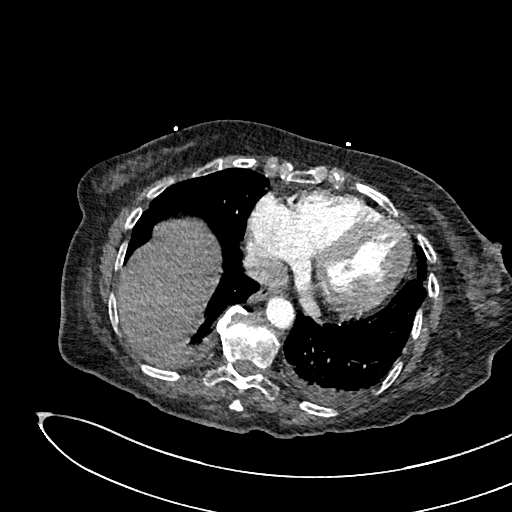
[im 211/474  soft-tissue]
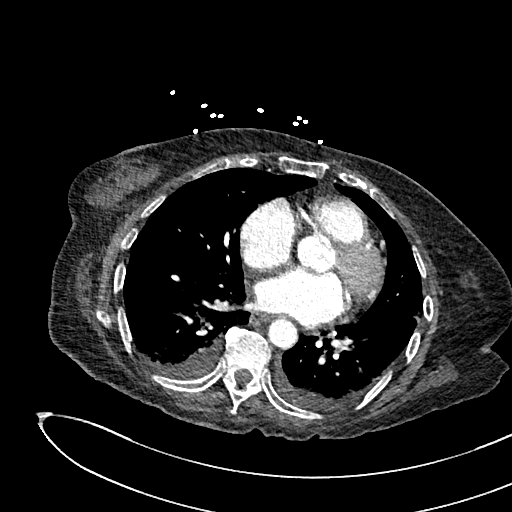
[im 263/474  soft-tissue]
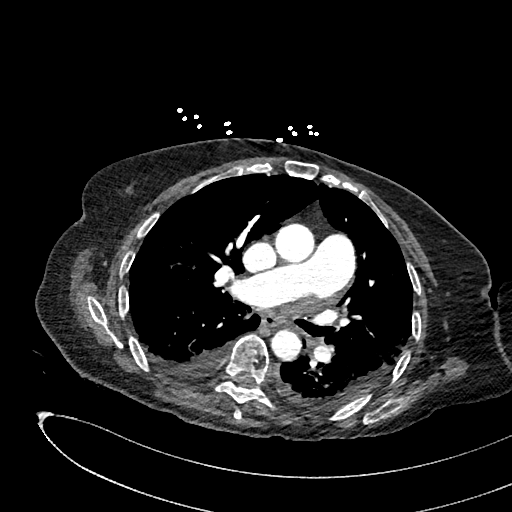
[im 316/474  soft-tissue]
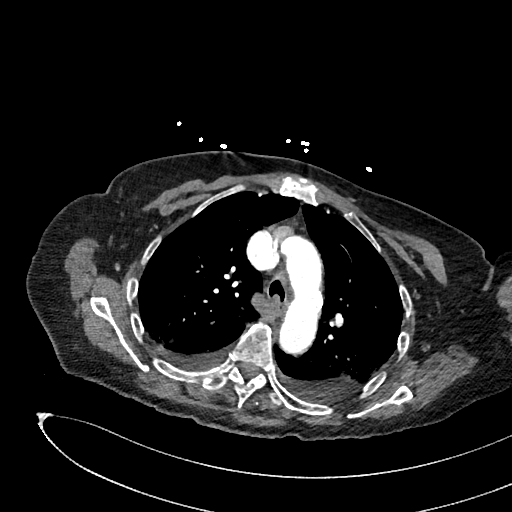
[im 368/474  soft-tissue]
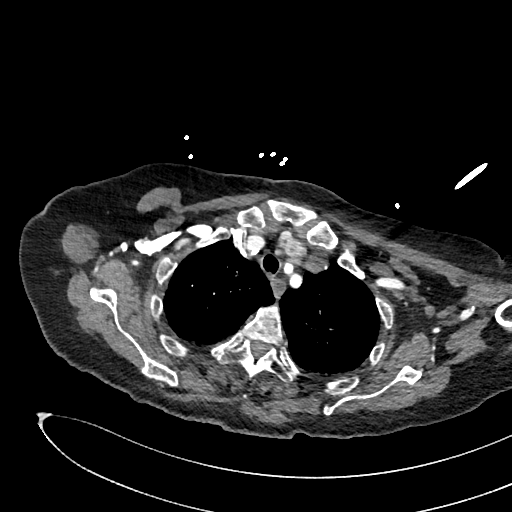

[Series 9: pe 2mm cor · coronal · 0.65mm/px · 2 of 150 slices shown, 3 images]
[im 50/150  soft-tissue]
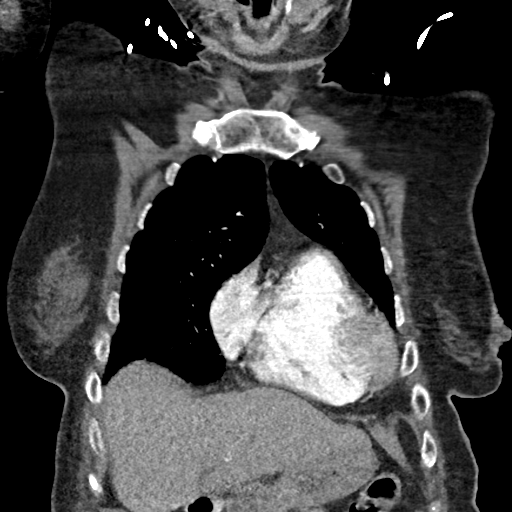
[im 50/150  bone]
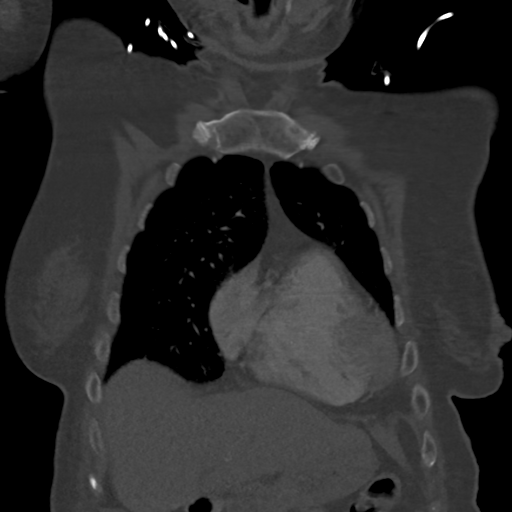
[im 100/150  soft-tissue]
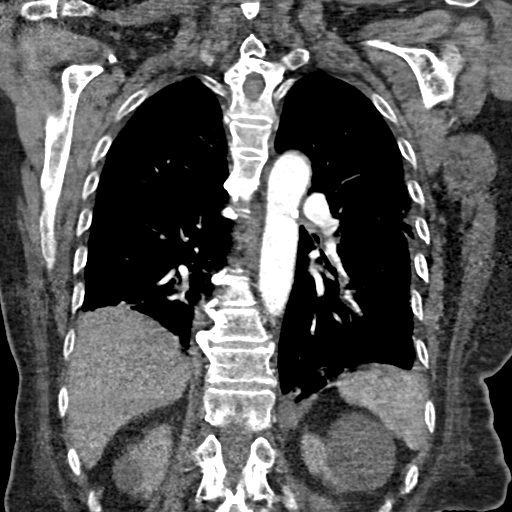

[12 of 46 positions shown; findings below may reference images not displayed]

RADIATION DOSE REDUCTION: This exam was performed according to the
departmental dose-optimization program which includes automated
exposure control, adjustment of the mA and/or kV according to
patient size and/or use of iterative reconstruction technique.

CONTRAST:  80mL OMNIPAQUE IOHEXOL 350 MG/ML SOLN
FINDINGS: CTA CHEST FINDINGS

Cardiovascular: Satisfactory opacification of the pulmonary arteries
to the segmental level. No evidence of pulmonary embolism.
Nonaneurysmal aorta. Mild atherosclerosis. No aneurysm or
dissection. Mild coronary vascular calcification. Borderline to mild
cardiomegaly. No pericardial effusion

Mediastinum/Nodes: Midline trachea. Heterogenous hypodense bilateral
thyroid nodules measuring up to 2.7 cm on the left. In the setting
of significant comorbidities or limited life expectancy, no
follow-up recommended (ref: [HOSPITAL]. [DATE]):
143-50). Esophagus within normal limits. Multiple subcentimeter
mediastinal lymph nodes.

Lungs/Pleura: Small bilateral effusions. Mild bilateral hazy
pulmonary density which could be due to diffuse atelectasis or mild
edema.

Musculoskeletal: Sternum is intact.  No acute osseous abnormality.

Review of the MIP images confirms the above findings.

CT ABDOMEN and PELVIS FINDINGS

Hepatobiliary: No focal liver abnormality is seen. No gallstones,
gallbladder wall thickening, or biliary dilatation.

Pancreas: Unremarkable. No pancreatic ductal dilatation or
surrounding inflammatory changes.

Spleen: Normal in size without focal abnormality.

Adrenals/Urinary Tract: Adrenal glands are within normal limits.
Kidneys show no hydronephrosis. Bilateral renal cysts, small amount
of layering echogenic debris within left midpole cyst. No specific
imaging follow-up recommended. Bladder is normal

Stomach/Bowel: Stomach is within normal limits. Appendix not seen
but no right lower quadrant inflammatory process. No evidence of
bowel wall thickening, distention, or inflammatory changes.
Diverticular disease of the colon.

Vascular/Lymphatic: Moderate aortic atherosclerosis. No aneurysm. No
suspicious lymph nodes.

Reproductive: Uterus unremarkable.  2 x 1.5 cm left adnexal cyst.

Other: Negative for pelvic effusion or free air

Musculoskeletal: No acute osseous abnormality

Review of the MIP images confirms the above findings.
IMPRESSION: 1. Negative for acute pulmonary embolus or aortic dissection.
2. Borderline to mild cardiomegaly with small bilateral effusion.
Mild diffuse bilateral hazy pulmonary density, question mild edema
3. No CT evidence for acute intra-abdominal or pelvic abnormality
4. 2 cm left adnexal cyst. No follow-up imaging recommended. Note:
This recommendation does not apply to premenarchal patients and to
those with increased risk (genetic, family history, elevated tumor
markers or other high-risk factors) of ovarian cancer. Reference:
JACR [DATE]):[id number]

## 2022-11-15 ENCOUNTER — Ambulatory Visit: Payer: Medicare HMO | Admitting: Podiatry

## 2022-11-20 ENCOUNTER — Encounter: Payer: Self-pay | Admitting: Podiatry

## 2022-11-20 ENCOUNTER — Ambulatory Visit: Payer: Medicare HMO | Admitting: Podiatry

## 2022-11-20 DIAGNOSIS — M79675 Pain in left toe(s): Secondary | ICD-10-CM

## 2022-11-20 DIAGNOSIS — B351 Tinea unguium: Secondary | ICD-10-CM

## 2022-11-20 DIAGNOSIS — M79674 Pain in right toe(s): Secondary | ICD-10-CM

## 2022-11-20 NOTE — Progress Notes (Signed)
  Subjective:  Patient ID: Robin Hobbs, female    DOB: Sep 22, 1925,   MRN: [id number]  Chief Complaint  Patient presents with   Nail Problem    Nail trim     89 y.o. female presents for concern of thickened elongated and painful nails that are difficult to trim. Requesting to have them trimmed today. PCP:  Rinaldo Cloud, MD    . Denies any other pedal complaints. Denies n/v/f/c.   Past Medical History:  Diagnosis Date   Hypertension     Objective:  Physical Exam: Vascular: DP/PT pulses 2/4 bilateral. CFT <3 seconds. Normal hair growth on digits. No edema.  Skin. No lacerations or abrasions bilateral feet. Nails 1-5 bilateral are thickened elongated and with subungual debris.  Musculoskeletal: MMT 5/5 bilateral lower extremities in DF, PF, Inversion and Eversion. Deceased ROM in DF of ankle joint.  Neurological: Sensation intact to light touch.   Assessment:   1. Pain due to onychomycosis of toenails of both feet      Plan:  Patient was evaluated and treated and all questions answered. -Mechanically debrided all nails 1-5 bilateral using sterile nail nipper and filed with dremel without incident  -Answered all patient questions -Patient to return  in 3 months for at risk foot care -Patient advised to call the office if any problems or questions arise in the meantime.   Louann Sjogren, DPM

## 2023-02-19 ENCOUNTER — Ambulatory Visit: Payer: Medicare HMO | Admitting: Podiatry
